# Patient Record
Sex: Male | Born: 1945
Health system: Southern US, Community
[De-identification: ages and names within clinical notes are randomized; demographics above are authoritative.]

## PROBLEM LIST (undated history)

## (undated) DIAGNOSIS — F329 Major depressive disorder, single episode, unspecified: Secondary | ICD-10-CM

## (undated) DIAGNOSIS — E559 Vitamin D deficiency, unspecified: Secondary | ICD-10-CM

## (undated) DIAGNOSIS — Z860100 Personal history of colon polyps, unspecified: Secondary | ICD-10-CM

## (undated) DIAGNOSIS — F32A Depression, unspecified: Secondary | ICD-10-CM

## (undated) DIAGNOSIS — E785 Hyperlipidemia, unspecified: Secondary | ICD-10-CM

## (undated) DIAGNOSIS — E0590 Thyrotoxicosis, unspecified without thyrotoxic crisis or storm: Secondary | ICD-10-CM

## (undated) HISTORY — DX: Depression, unspecified: F32.A

## (undated) HISTORY — DX: Major depressive disorder, single episode, unspecified: F32.9

## (undated) HISTORY — PX: OTHER SURGICAL HISTORY: SHX169

## (undated) HISTORY — DX: Vitamin D deficiency, unspecified: E55.9

## (undated) HISTORY — DX: Hyperlipidemia, unspecified: E78.5

## (undated) HISTORY — DX: Personal history of colon polyps, unspecified: Z86.0100

## (undated) HISTORY — PX: APPENDECTOMY: SHX54

## (undated) HISTORY — DX: Personal history of colon polyps: Z86.010

## (undated) HISTORY — DX: Thyrotoxicosis, unspecified without thyrotoxic crisis or storm: E05.90

---

## 2013-01-19 ENCOUNTER — Ambulatory Visit (INDEPENDENT_AMBULATORY_CARE_PROVIDER_SITE_OTHER): Payer: Managed Care, Other (non HMO) | Admitting: Family Medicine

## 2013-01-19 VITALS — BP 138/80 | HR 86 | Temp 98.0°F | Resp 16 | Ht 70.0 in | Wt 189.0 lb

## 2013-01-19 DIAGNOSIS — F329 Major depressive disorder, single episode, unspecified: Secondary | ICD-10-CM

## 2013-01-19 DIAGNOSIS — F32A Depression, unspecified: Secondary | ICD-10-CM

## 2013-01-19 DIAGNOSIS — F3289 Other specified depressive episodes: Secondary | ICD-10-CM

## 2013-01-19 DIAGNOSIS — E785 Hyperlipidemia, unspecified: Secondary | ICD-10-CM

## 2013-01-19 LAB — COMPREHENSIVE METABOLIC PANEL
ALT: 29 U/L (ref 0–53)
Albumin: 4.5 g/dL (ref 3.5–5.2)
Alkaline Phosphatase: 118 U/L — ABNORMAL HIGH (ref 39–117)
BUN: 17 mg/dL (ref 6–23)
CO2: 30 mEq/L (ref 19–32)
Calcium: 9.6 mg/dL (ref 8.4–10.5)
Chloride: 103 mEq/L (ref 96–112)
Potassium: 4.5 mEq/L (ref 3.5–5.3)
Total Protein: 7 g/dL (ref 6.0–8.3)

## 2013-01-19 LAB — LIPID PANEL
Cholesterol: 167 mg/dL (ref 0–200)
LDL Cholesterol: 90 mg/dL (ref 0–99)
Total CHOL/HDL Ratio: 3.4 Ratio
Triglycerides: 140 mg/dL (ref <150)
VLDL: 28 mg/dL (ref 0–40)

## 2013-01-19 MED ORDER — BUPROPION HCL ER (SMOKING DET) 150 MG PO TB12: 150.0000 mg | ORAL_TABLET | Freq: Two times a day (BID) | ORAL | Status: DC

## 2013-01-19 MED ORDER — SIMVASTATIN 40 MG PO TABS: 40.0000 mg | ORAL_TABLET | Freq: Every day | ORAL | Status: DC

## 2013-01-19 NOTE — Patient Instructions (Signed)
Continue current medications  Only no the results of your labs in a few days

## 2013-01-19 NOTE — Progress Notes (Signed)
Subjective: Patient recently moved here from [place]. He needs someone to represcribed his medicine for depression and hyperlipidemia. He has been pretty healthy. Her only surgeries were appendectomy and tonsillectomy. He has not had any major medical illnesses. He had his flu shot this year. Last colonoscopy was about 15 years ago, and I told him he needs to get known some time does not smoke. He found a job here and they move to be closer to their son. He is about out of his medications.  Objective: Pleasant gentleman in no acute distress. TMs normal. Throat clear. Neck supple without nodes thyromegaly. Chest clear to auscultation. Heart regular without murmurs. And soft nontender.  Assessment: Hyperlipidemia Depression stable  Plan: Refill his medications Check lipids and competent metabolic panel Return in about 3 months

## 2013-01-25 MED ORDER — BUPROPION HCL ER (SR) 150 MG PO TB12: 150.0000 mg | ORAL_TABLET | Freq: Two times a day (BID) | ORAL | Status: DC

## 2013-01-25 NOTE — Addendum Note (Signed)
Addended by: Thelma Barge D on: 01/25/2013 03:29 PM   Modules accepted: Orders

## 2013-07-11 ENCOUNTER — Other Ambulatory Visit: Payer: Self-pay | Admitting: Family Medicine

## 2013-07-12 ENCOUNTER — Other Ambulatory Visit: Payer: Self-pay | Admitting: Family Medicine

## 2016-06-03 DIAGNOSIS — R69 Illness, unspecified: Secondary | ICD-10-CM | POA: Diagnosis not present

## 2016-11-04 ENCOUNTER — Ambulatory Visit (INDEPENDENT_AMBULATORY_CARE_PROVIDER_SITE_OTHER): Payer: Medicare HMO | Admitting: Family Medicine

## 2016-11-04 ENCOUNTER — Encounter: Payer: Self-pay | Admitting: Family Medicine

## 2016-11-04 VITALS — BP 143/84 | HR 62 | Temp 97.4°F | Ht 68.3 in | Wt 184.1 lb

## 2016-11-04 DIAGNOSIS — Z23 Encounter for immunization: Secondary | ICD-10-CM

## 2016-11-04 DIAGNOSIS — E782 Mixed hyperlipidemia: Secondary | ICD-10-CM

## 2016-11-04 DIAGNOSIS — Z125 Encounter for screening for malignant neoplasm of prostate: Secondary | ICD-10-CM | POA: Diagnosis not present

## 2016-11-04 DIAGNOSIS — R6882 Decreased libido: Secondary | ICD-10-CM | POA: Diagnosis not present

## 2016-11-04 DIAGNOSIS — F3341 Major depressive disorder, recurrent, in partial remission: Secondary | ICD-10-CM

## 2016-11-04 MED ORDER — ESCITALOPRAM OXALATE 10 MG PO TABS: 10.0000 mg | ORAL_TABLET | Freq: Every day | ORAL | 1 refills | Status: DC

## 2016-11-04 MED ORDER — SIMVASTATIN 40 MG PO TABS: 40.0000 mg | ORAL_TABLET | Freq: Every day | ORAL | 1 refills | Status: DC

## 2016-11-04 MED ORDER — BUPROPION HCL ER (SR) 150 MG PO TB12: 150.0000 mg | ORAL_TABLET | Freq: Two times a day (BID) | ORAL | 1 refills | Status: DC

## 2016-11-04 NOTE — Progress Notes (Signed)
BP (!) 143/84 (BP Location: Left Arm, Patient Position: Sitting, Cuff Size: Normal)   Pulse 62   Temp (!) 97.4 F (36.3 C)   Ht 5' 8.3" (1.735 m)   Wt 184 lb 1 oz (83.5 kg)   SpO2 98%   BMI 27.74 kg/m    Subjective:    Patient ID: Wayne Robles, male    DOB: Apr 10, 1945, 71 y.o.   MRN: [id number]  HPIOmar Person Robles is a 71 y.o. male  Chief Complaint  Patient presents with  . Establish Care   DEPRESSION- notes that he has not had the energy that he would like and has been having some issues with decreased libido Mood status: stable Satisfied with current treatment?: no Symptom severity: mild  Duration of current treatment : chronic Side effects: yes- sexual side effects, getting worse Medication compliance: excellent compliance Psychotherapy/counseling: no  Previous psychiatric medications: wellbutrin, prozac Depressed mood: no Anxious mood: no Anhedonia: no Significant weight loss or gain: no Insomnia: no  Fatigue: no Feelings of worthlessness or guilt: no Impaired concentration/indecisiveness: no Suicidal ideations: no Hopelessness: no Crying spells: no Depression screen [facility] 2/9 11/04/2016 11/04/2016  Decreased Interest 0 0  Down, Depressed, Hopeless 0 0  PHQ - 2 Score 0 0  Altered sleeping 0 -  Tired, decreased energy 0 -  Change in appetite 0 -  Feeling bad or failure about yourself  0 -  Trouble concentrating 0 -  Moving slowly or fidgety/restless 0 -  Suicidal thoughts 0 -  PHQ-9 Score 0 -    HYPERLIPIDEMIA Hyperlipidemia status: excellent compliance Satisfied with current treatment?  yes Side effects:  no Medication compliance: excellent compliance Past cholesterol meds: atorvastatin Supplements: none Aspirin:  no The ASCVD Risk score Wayne Robles., et al., 2013) failed to calculate for the following reasons:   Cannot find a previous HDL lab   Cannot find a previous total cholesterol lab Chest pain:  no Coronary artery disease:  no Family  history CAD:  no   Active Ambulatory Problems    Diagnosis Date Noted  . Depression 01/19/2013  . Hyperlipidemia 01/19/2013   Resolved Ambulatory Problems    Diagnosis Date Noted  . No Resolved Ambulatory Problems   Past Medical History:  Diagnosis Date  . Depression   . Hyperlipidemia    Past Surgical History:  Procedure Laterality Date  . APPENDECTOMY    . tonsillectomy     Outpatient Encounter Prescriptions as of 11/04/2016  Medication Sig  . [DISCONTINUED] FLUoxetine (PROZAC) 20 MG tablet Take 20 mg by mouth daily.  Marland Kitchen buPROPion (WELLBUTRIN SR) 150 MG 12 hr tablet Take 1 tablet (150 mg total) by mouth 2 (two) times daily. PATIENT NEEDS OFFICE VISIT FOR ADDITIONAL REFILLS  . escitalopram (LEXAPRO) 10 MG tablet Take 1 tablet (10 mg total) by mouth daily.  . simvastatin (ZOCOR) 40 MG tablet Take 1 tablet (40 mg total) by mouth daily.  . [DISCONTINUED] aspirin EC 81 MG tablet Take 81 mg by mouth daily.  . [DISCONTINUED] buPROPion (WELLBUTRIN SR) 150 MG 12 hr tablet Take 1 tablet (150 mg total) by mouth 2 (two) times daily. PATIENT NEEDS OFFICE VISIT FOR ADDITIONAL REFILLS  . [DISCONTINUED] Multiple Vitamin (MULTIVITAMIN) capsule Take 1 capsule by mouth daily.  . [DISCONTINUED] simvastatin (ZOCOR) 40 MG tablet Take 1 tablet (40 mg total) by mouth daily.   No facility-administered encounter medications on file as of 11/04/2016.    No Known Allergies  Social History   Social History  .  Marital status: Married    Spouse name: N/A  . Number of children: N/A  . Years of education: N/A   Occupational History  . Not on file.   Social History Main Topics  . Smoking status: Never Smoker  . Smokeless tobacco: Never Used  . Alcohol use No  . Drug use: No  . Sexual activity: Yes   Other Topics Concern  . Not on file   Social History Narrative  . No narrative on file   Family History  Problem Relation Age of Onset  . Cancer Mother        Ovarian  . Diabetes Father   .  Cancer Father        Prostate    Review of Systems  Constitutional: Negative.   Respiratory: Negative.   Cardiovascular: Negative.   Gastrointestinal: Negative.   Genitourinary:       Decreased libido  Psychiatric/Behavioral: Negative.     Per HPI unless specifically indicated above     Objective:    BP (!) 143/84 (BP Location: Left Arm, Patient Position: Sitting, Cuff Size: Normal)   Pulse 62   Temp (!) 97.4 F (36.3 C)   Ht 5' 8.3" (1.735 m)   Wt 184 lb 1 oz (83.5 kg)   SpO2 98%   BMI 27.74 kg/m   Wt Readings from Last 3 Encounters:  11/04/16 184 lb 1 oz (83.5 kg)  01/19/13 189 lb (85.7 kg)    Physical Exam  Constitutional: He is oriented to person, place, and time. He appears well-developed and well-nourished. No distress.  HENT:  Head: Normocephalic and atraumatic.  Right Ear: Hearing normal.  Left Ear: Hearing normal.  Nose: Nose normal.  Eyes: Conjunctivae and lids are normal. Right eye exhibits no discharge. Left eye exhibits no discharge. No scleral icterus.  Cardiovascular: Normal rate, regular rhythm, normal heart sounds and intact distal pulses.  Exam reveals no gallop and no friction rub.   No murmur heard. Pulmonary/Chest: Effort normal and breath sounds normal. No respiratory distress. He has no wheezes. He has no rales. He exhibits no tenderness.  Musculoskeletal: Normal range of motion.  Neurological: He is alert and oriented to person, place, and time.  Skin: Skin is warm, dry and intact. No rash noted. He is not diaphoretic. No erythema. No pallor.  Psychiatric: He has a normal mood and affect. His speech is normal and behavior is normal. Judgment and thought content normal. Cognition and memory are normal.  Nursing note and vitals reviewed.   Results for orders placed or performed in visit on 01/19/13  Lipid panel  Result Value Ref Range   Cholesterol 167 0 - 200 mg/dL   Triglycerides 621 <308 mg/dL   HDL 49 >65 mg/dL   Total CHOL/HDL Ratio  3.4 Ratio   VLDL 28 0 - 40 mg/dL   LDL Cholesterol 90 0 - 99 mg/dL  Comprehensive metabolic panel  Result Value Ref Range   Sodium 139 135 - 145 mEq/L   Potassium 4.5 3.5 - 5.3 mEq/L   Chloride 103 96 - 112 mEq/L   CO2 30 19 - 32 mEq/L   Glucose, Bld 96 70 - 99 mg/dL   BUN 17 6 - 23 mg/dL   Creat 7.84 6.96 - 2.95 mg/dL   Total Bilirubin 0.9 0.3 - 1.2 mg/dL   Alkaline Phosphatase 118 (H) 39 - 117 U/L   AST 33 0 - 37 U/L   ALT 29 0 - 53 U/L   Total  Protein 7.0 6.0 - 8.3 g/dL   Albumin 4.5 3.5 - 5.2 g/dL   Calcium 9.6 8.4 - 95.6 mg/dL      Assessment & Plan:   Problem List Items Addressed This Visit      Other   Depression - Primary    Under good control but having issues with his prozac, so will change to lexparo. Recheck by phone in 1 month. Call with any concerns.       Relevant Medications   escitalopram (LEXAPRO) 10 MG tablet   buPROPion (WELLBUTRIN SR) 150 MG 12 hr tablet   Other Relevant Orders   CBC with Differential/Platelet   TSH   Hyperlipidemia    Has been stable per patient. Will check labs and continue current regimen. Continue to monitor. Call with any concerns.       Relevant Medications   simvastatin (ZOCOR) 40 MG tablet   Other Relevant Orders   Comprehensive metabolic panel   Lipid Panel w/o Chol/HDL Ratio    Other Visit Diagnoses    Screening for prostate cancer       Labs checked today- await results.    Relevant Orders   PSA   Immunization due       Flu shot given today- will check records on other vaccines.    Relevant Orders   HIV antibody   Flu vaccine HIGH DOSE PF (Fluzone High dose) (Completed)   Decreased libido       ?due to prozac- will change to lexapro and check in by phone in 1 month. Call with any concerns. [place] checking testosterone.   Relevant Orders   Testosterone, free, total       Follow up plan: Return in about 6 months (around 05/04/2017) for Wellness, records release please.

## 2016-11-04 NOTE — Assessment & Plan Note (Signed)
Under good control but having issues with his prozac, so will change to lexparo. Recheck by phone in 1 month. Call with any concerns.

## 2016-11-04 NOTE — Assessment & Plan Note (Signed)
Has been stable per patient. Will check labs and continue current regimen. Continue to monitor. Call with any concerns.

## 2016-11-05 LAB — CBC WITH DIFFERENTIAL/PLATELET
BASOS ABS: 0 10*3/uL (ref 0.0–0.2)
Basos: 0 %
EOS (ABSOLUTE): 0 10*3/uL (ref 0.0–0.4)
Eos: 1 %
Hematocrit: 47.2 % (ref 37.5–51.0)
Hemoglobin: 15.9 g/dL (ref 13.0–17.7)
Immature Grans (Abs): 0 10*3/uL (ref 0.0–0.1)
Immature Granulocytes: 1 %
LYMPHS: 23 %
Lymphocytes Absolute: 1.3 10*3/uL (ref 0.7–3.1)
MCH: 31.2 pg (ref 26.6–33.0)
MCHC: 33.7 g/dL (ref 31.5–35.7)
MCV: 93 fL (ref 79–97)
Monocytes Absolute: 0.5 10*3/uL (ref 0.1–0.9)
Monocytes: 8 %
NEUTROS ABS: 4 10*3/uL (ref 1.4–7.0)
Neutrophils: 67 %
Platelets: 206 10*3/uL (ref 150–379)
RBC: 5.1 x10E6/uL (ref 4.14–5.80)
RDW: 14.1 % (ref 12.3–15.4)
WBC: 5.9 10*3/uL (ref 3.4–10.8)

## 2016-11-05 LAB — LIPID PANEL W/O CHOL/HDL RATIO
Cholesterol, Total: 235 mg/dL — ABNORMAL HIGH (ref 100–199)
HDL: 36 mg/dL — ABNORMAL LOW (ref >39)
Triglycerides: 493 mg/dL — ABNORMAL HIGH (ref 0–149)

## 2016-11-05 LAB — COMPREHENSIVE METABOLIC PANEL
ALBUMIN: 4.4 g/dL (ref 3.6–4.8)
ALT: 25 IU/L (ref 0–44)
AST: 31 IU/L (ref 0–40)
Albumin/Globulin Ratio: 1.8 (ref 1.2–2.2)
Alkaline Phosphatase: 130 IU/L — ABNORMAL HIGH (ref 39–117)
BUN/Creatinine Ratio: 20 (ref 10–24)
BUN: 20 mg/dL (ref 8–27)
Bilirubin Total: 0.9 mg/dL (ref 0.0–1.2)
CHLORIDE: 101 mmol/L (ref 96–106)
CO2: 22 mmol/L (ref 20–29)
Calcium: 9.3 mg/dL (ref 8.6–10.2)
Creatinine, Ser: 1.01 mg/dL (ref 0.76–1.27)
GFR calc non Af Amer: 76 mL/min/{1.73_m2} (ref >59)
GFR, EST AFRICAN AMERICAN: 88 mL/min/{1.73_m2} (ref >59)
GLUCOSE: 95 mg/dL (ref 65–99)
Globulin, Total: 2.4 g/dL (ref 1.5–4.5)
Potassium: 4.4 mmol/L (ref 3.5–5.2)
Sodium: 140 mmol/L (ref 134–144)
TOTAL PROTEIN: 6.8 g/dL (ref 6.0–8.5)

## 2016-11-05 LAB — TSH: TSH: 2.27 u[IU]/mL (ref 0.450–4.500)

## 2016-11-05 LAB — TESTOSTERONE, FREE, TOTAL, SHBG
Sex Hormone Binding: 69.5 nmol/L (ref 19.3–76.4)
TESTOSTERONE FREE: 5.7 pg/mL — AB (ref 6.6–18.1)
Testosterone: 421 ng/dL (ref 264–916)

## 2016-11-05 LAB — HIV ANTIBODY (ROUTINE TESTING W REFLEX): HIV SCREEN 4TH GENERATION: NONREACTIVE

## 2016-11-05 LAB — PSA: Prostate Specific Ag, Serum: 1.4 ng/mL (ref 0.0–4.0)

## 2016-11-07 ENCOUNTER — Telehealth: Payer: Self-pay | Admitting: Family Medicine

## 2016-11-07 ENCOUNTER — Encounter: Payer: Self-pay | Admitting: Family Medicine

## 2016-11-07 NOTE — Telephone Encounter (Signed)
Patient notified

## 2016-11-07 NOTE — Telephone Encounter (Signed)
Please let him know that his labs look good. His cholesterol is a little high, but everything else looks great. If it's still high in 6 months we may adjust his dose. Thanks!

## 2016-12-02 ENCOUNTER — Encounter: Payer: Self-pay | Admitting: Family Medicine

## 2016-12-02 MED ORDER — SILDENAFIL CITRATE 100 MG PO TABS: 50.0000 mg | ORAL_TABLET | Freq: Every day | ORAL | 11 refills | Status: DC | PRN

## 2016-12-09 MED ORDER — SILDENAFIL CITRATE 100 MG PO TABS: 50.0000 mg | ORAL_TABLET | Freq: Every day | ORAL | 11 refills | Status: DC | PRN

## 2016-12-09 NOTE — Addendum Note (Signed)
Addended by: Dorcas CarrowJOHNSON, Aspen Deterding P on: 12/09/2016 05:05 PM   Modules accepted: Orders

## 2016-12-09 NOTE — Telephone Encounter (Signed)
Dr.Johnson, [facility].

## 2017-01-20 ENCOUNTER — Encounter: Payer: Self-pay | Admitting: Family Medicine

## 2017-01-20 MED ORDER — SIMVASTATIN 40 MG PO TABS: 40.0000 mg | ORAL_TABLET | Freq: Every day | ORAL | 1 refills | Status: DC

## 2017-01-20 MED ORDER — ESCITALOPRAM OXALATE 10 MG PO TABS: 10.0000 mg | ORAL_TABLET | Freq: Every day | ORAL | 1 refills | Status: DC

## 2017-01-20 MED ORDER — BUPROPION HCL ER (SR) 150 MG PO TB12: 150.0000 mg | ORAL_TABLET | Freq: Two times a day (BID) | ORAL | 1 refills | Status: DC

## 2017-05-08 ENCOUNTER — Encounter: Payer: Medicare HMO | Admitting: Family Medicine

## 2019-11-19 ENCOUNTER — Telehealth: Payer: Self-pay

## 2019-11-19 NOTE — Telephone Encounter (Signed)
Pt had np appt 11/04/2016. Please advise if pt needs to reestablish as np the first of the year?  Copied from CRM [id number]. Topic: Appointment Scheduling - Scheduling Inquiry for Clinic >> Nov 19, 2019  3:03 PM Wayne Robles wrote: Reason for CRM: pt states he just moved back into the area and would like to know if Dr Laural Benes will accept him back as a pt? He only saw her once 11/04/2016

## 2019-11-19 NOTE — Telephone Encounter (Signed)
Sure- Jan

## 2019-11-20 NOTE — Telephone Encounter (Signed)
Lvm letting pt know we can schedule NP apt at the end of January.

## 2019-12-20 DIAGNOSIS — I10 Essential (primary) hypertension: Secondary | ICD-10-CM | POA: Diagnosis not present

## 2019-12-20 DIAGNOSIS — F419 Anxiety disorder, unspecified: Secondary | ICD-10-CM | POA: Diagnosis not present

## 2019-12-20 DIAGNOSIS — E0590 Thyrotoxicosis, unspecified without thyrotoxic crisis or storm: Secondary | ICD-10-CM | POA: Diagnosis not present

## 2020-01-09 LAB — LIPID PANEL
Cholesterol: 256 — AB (ref 0–200)
HDL: 39 (ref 35–70)
LDL Cholesterol: 144
Triglycerides: 363 — AB (ref 40–160)

## 2020-01-09 LAB — BASIC METABOLIC PANEL
BUN: 21 (ref 4–21)
CO2: 30 — AB (ref 13–22)
Chloride: 102 (ref 99–108)
Creatinine: 0.9 (ref 0.6–1.3)
Glucose: 85
Potassium: 4.4 (ref 3.4–5.3)
Sodium: 136 — AB (ref 137–147)

## 2020-01-09 LAB — HEPATIC FUNCTION PANEL
ALT: 33 (ref 10–40)
AST: 32 (ref 14–40)
Alkaline Phosphatase: 119 (ref 25–125)
Bilirubin, Total: 1.2

## 2020-01-09 LAB — VITAMIN D 25 HYDROXY (VIT D DEFICIENCY, FRACTURES): Vit D, 25-Hydroxy: 24.64

## 2020-01-09 LAB — COMPREHENSIVE METABOLIC PANEL
Albumin: 4.6 (ref 3.5–5.0)
Calcium: 9.3 (ref 8.7–10.7)
GFR calc non Af Amer: 83
Globulin: 2.6

## 2020-01-09 LAB — TSH: TSH: 2.27 (ref 0.41–5.90)

## 2020-01-09 LAB — HEMOGLOBIN A1C: Hemoglobin A1C: 6

## 2020-02-25 LAB — HM COLONOSCOPY

## 2020-03-12 ENCOUNTER — Ambulatory Visit: Payer: Medicare HMO | Admitting: Family Medicine

## 2020-05-29 ENCOUNTER — Other Ambulatory Visit: Payer: Self-pay

## 2020-05-29 ENCOUNTER — Encounter: Payer: Self-pay | Admitting: Family Medicine

## 2020-05-29 ENCOUNTER — Ambulatory Visit: Payer: Medicare HMO | Admitting: Family Medicine

## 2020-05-29 VITALS — BP 131/89 | HR 74 | Temp 98.2°F | Ht 69.25 in | Wt 188.4 lb

## 2020-05-29 DIAGNOSIS — Z125 Encounter for screening for malignant neoplasm of prostate: Secondary | ICD-10-CM | POA: Diagnosis not present

## 2020-05-29 DIAGNOSIS — R6882 Decreased libido: Secondary | ICD-10-CM

## 2020-05-29 DIAGNOSIS — F3341 Major depressive disorder, recurrent, in partial remission: Secondary | ICD-10-CM | POA: Diagnosis not present

## 2020-05-29 DIAGNOSIS — E782 Mixed hyperlipidemia: Secondary | ICD-10-CM

## 2020-05-29 DIAGNOSIS — Z8639 Personal history of other endocrine, nutritional and metabolic disease: Secondary | ICD-10-CM

## 2020-05-29 LAB — URINALYSIS, ROUTINE W REFLEX MICROSCOPIC
Bilirubin, UA: NEGATIVE
Glucose, UA: NEGATIVE
Ketones, UA: NEGATIVE
Leukocytes,UA: NEGATIVE
Nitrite, UA: NEGATIVE
Protein,UA: NEGATIVE
RBC, UA: NEGATIVE
Specific Gravity, UA: <1.005 — ABNORMAL LOW (ref 1.005–1.030)
Urobilinogen, Ur: 0.2 mg/dL (ref 0.2–1.0)
pH, UA: 6 (ref 5.0–7.5)

## 2020-05-29 LAB — MICROALBUMIN, URINE WAIVED
Creatinine, Urine Waived: 50 mg/dL (ref 10–300)
Microalb, Ur Waived: 10 mg/L (ref 0–19)
Microalb/Creat Ratio: <30 mg/g (ref <30)

## 2020-05-29 LAB — BAYER DCA HB A1C WAIVED: HB A1C (BAYER DCA - WAIVED): 5.9 % (ref <7.0)

## 2020-05-29 MED ORDER — BUPROPION HCL ER (XL) 300 MG PO TB24: 300.0000 mg | ORAL_TABLET | Freq: Every day | ORAL | 3 refills | Status: DC

## 2020-05-29 NOTE — Progress Notes (Signed)
BP 131/89   Pulse 74   Temp 98.2 F (36.8 C)   Ht 5' 9.25" (1.759 m)   Wt 188 lb 6.4 oz (85.5 kg)   SpO2 97%   BMI 27.62 kg/m    Subjective:    Patient ID: Wayne Robles, male    DOB: 19-Apr-1945, 75 y.o.   MRN: [id number]  HPI: Wayne GourdRichard P Nazir is a 75 y.o. male who presents today to establish care [place], [place] and is here to reestablish, [facility] and he felt like he was just giving him medicine and was not happy with the care he was getting there.   Chief Complaint  Patient presents with  . re-establish care    Patient would like to discuss his medications   . Hyperlipidemia  . Depression   HYPERLIPIDEMIA Hyperlipidemia status: stable Satisfied with current treatment?  yes Side effects:  no Medication compliance: excellent compliance Past cholesterol meds: simvastatin, zetia Supplements: none Aspirin:  no The ASCVD Risk score Denman George(Goff DC Jr., et al., 2013) failed to calculate for the following reasons:   Cannot find a previous HDL lab   Cannot find a previous total cholesterol lab Chest pain:  no Coronary artery disease:  no  DEPRESSION- has been following with psychiatrist who started him on klonopin. He notes that he does not need a refill at this time.  Mood status: uncontrolled Satisfied with current treatment?: no Symptom severity: moderate  Duration of current treatment : months Side effects: no Medication compliance: fair compliance Psychotherapy/counseling: no  Previous psychiatric medications: lexapro, wellbutrin, clonazepam Depressed mood: yes Anxious mood: yes Anhedonia: no Significant weight loss or gain: no Insomnia: no  Fatigue: yes Feelings of worthlessness or guilt: no Impaired concentration/indecisiveness: yes Suicidal ideations: no Hopelessness: no Crying spells: no Depression screen [facility] 2/9 05/29/2020 11/04/2016 11/04/2016  Decreased Interest 2 0 0  Down,  Depressed, Hopeless 2 0 0  PHQ - 2 Score 4 0 0  Altered sleeping 3 0 -  Tired, decreased energy 0 0 -  Change in appetite 0 0 -  Feeling bad or failure about yourself  0 0 -  Trouble concentrating 1 0 -  Moving slowly or fidgety/restless 0 0 -  Suicidal thoughts 0 0 -  PHQ-9 Score 8 0 -  Difficult doing work/chores Somewhat difficult - -    Active Ambulatory Problems    Diagnosis Date Noted  . Depression 01/19/2013  . Hyperlipidemia 01/19/2013   Resolved Ambulatory Problems    Diagnosis Date Noted  . No Resolved Ambulatory Problems   No Additional Past Medical History   Past Surgical History:  Procedure Laterality Date  . APPENDECTOMY    . tonsillectomy     Outpatient Encounter Medications as of 05/29/2020  Medication Sig Note  . buPROPion (WELLBUTRIN XL) 300 MG 24 hr tablet Take 1 tablet (300 mg total) by mouth daily.   . clonazePAM (KLONOPIN) 0.5 MG tablet Take 0.5 mg by mouth. 05/29/2020: Take 1/2 tablet as needed.  . ezetimibe (ZETIA) 10 MG tablet Take 10 mg by mouth daily. 05/29/2020: Take 1/2 tablet  . Multiple Vitamin (MULTIVITAMIN) tablet Take 1 tablet by mouth daily.   . simvastatin (ZOCOR) 40 MG tablet Take 1 tablet (40 mg total) by mouth daily.   . [DISCONTINUED] buPROPion (WELLBUTRIN SR) 150 MG 12 hr tablet Take 1 tablet (150 mg total) by mouth 2 (two) times daily.  PATIENT NEEDS OFFICE VISIT FOR ADDITIONAL REFILLS   . [DISCONTINUED] escitalopram (LEXAPRO) 10 MG tablet Take 1 tablet (10 mg total) by mouth daily. (Patient not taking: Reported on 05/29/2020)   . [DISCONTINUED] metFORMIN (GLUCOPHAGE) 500 MG tablet Take by mouth 2 (two) times daily with a meal. (Patient not taking: Reported on 05/29/2020) 05/29/2020: 1/2 tablet BID  . [DISCONTINUED] sildenafil (VIAGRA) 100 MG tablet Take 0.5-1 tablets (50-100 mg total) by mouth daily as needed for erectile dysfunction.    No facility-administered encounter medications on file as of 05/29/2020.   No Known Allergies  Social  History   Socioeconomic History  . Marital status: Married    Spouse name: Not on file  . Number of children: Not on file  . Years of education: Not on file  . Highest education level: Not on file  Occupational History  . Not on file  Tobacco Use  . Smoking status: Never Smoker  . Smokeless tobacco: Never Used  Vaping Use  . Vaping Use: Never used  Substance and Sexual Activity  . Alcohol use: No  . Drug use: No  . Sexual activity: Yes  Other Topics Concern  . Not on file  Social History Narrative  . Not on file   Social Determinants of Health   Financial Resource Strain: Not on file  Food Insecurity: Not on file  Transportation Needs: Not on file  Physical Activity: Not on file  Stress: Not on file  Social Connections: Not on file   Family History  Problem Relation Age of Onset  . Cancer Mother        Ovarian  . Diabetes Father   . Cancer Father        Prostate    Review of Systems  Constitutional: Negative.   Respiratory: Negative.   Cardiovascular: Negative.   Gastrointestinal: Negative.   Musculoskeletal: Negative.   Psychiatric/Behavioral: Positive for dysphoric mood. Negative for agitation, behavioral problems, confusion, decreased concentration, hallucinations, self-injury, sleep disturbance and suicidal ideas. The patient is nervous/anxious. The patient is not hyperactive.     Per HPI unless specifically indicated above     Objective:    BP 131/89   Pulse 74   Temp 98.2 F (36.8 C)   Ht 5' 9.25" (1.759 m)   Wt 188 lb 6.4 oz (85.5 kg)   SpO2 97%   BMI 27.62 kg/m   Wt Readings from Last 3 Encounters:  05/29/20 188 lb 6.4 oz (85.5 kg)  11/04/16 184 lb 1 oz (83.5 kg)  01/19/13 189 lb (85.7 kg)    Physical Exam Vitals and nursing note reviewed.  Constitutional:      General: He is not in acute distress.    Appearance: Normal appearance. He is not ill-appearing, toxic-appearing or diaphoretic.  HENT:     Head: Normocephalic and atraumatic.      Right Ear: External ear normal.     Left Ear: External ear normal.     Nose: Nose normal.     Mouth/Throat:     Mouth: Mucous membranes are moist.     Pharynx: Oropharynx is clear.  Eyes:     General: No scleral icterus.       Right eye: No discharge.        Left eye: No discharge.     Extraocular Movements: Extraocular movements intact.     Conjunctiva/sclera: Conjunctivae normal.     Pupils: Pupils are equal, round, and reactive to light.  Cardiovascular:     Rate  and Rhythm: Normal rate and regular rhythm.     Pulses: Normal pulses.     Heart sounds: Normal heart sounds. No murmur heard. No friction rub. No gallop.   Pulmonary:     Effort: Pulmonary effort is normal. No respiratory distress.     Breath sounds: Normal breath sounds. No stridor. No wheezing, rhonchi or rales.  Chest:     Chest wall: No tenderness.  Musculoskeletal:        General: Normal range of motion.     Cervical back: Normal range of motion and neck supple.  Skin:    General: Skin is warm and dry.     Capillary Refill: Capillary refill takes less than 2 seconds.     Coloration: Skin is not jaundiced or pale.     Findings: No bruising, erythema, lesion or rash.  Neurological:     General: No focal deficit present.     Mental Status: He is alert and oriented to person, place, and time. Mental status is at baseline.  Psychiatric:        Mood and Affect: Mood normal.        Behavior: Behavior normal.        Thought Content: Thought content normal.        Judgment: Judgment normal.     Results for orders placed or performed in visit on 11/04/16  CBC with Differential/Platelet  Result Value Ref Range   WBC 5.9 3.4 - 10.8 x10E3/uL   RBC 5.10 4.14 - 5.80 x10E6/uL   Hemoglobin 15.9 13.0 - 17.7 g/dL   Hematocrit 29.9 37.1 - 51.0 %   MCV 93 79 - 97 fL   MCH 31.2 26.6 - 33.0 pg   MCHC 33.7 31.5 - 35.7 g/dL   RDW 69.6 78.9 - 38.1 %   Platelets 206 150 - 379 x10E3/uL   Neutrophils 67 Not Estab. %    Lymphs 23 Not Estab. %   Monocytes 8 Not Estab. %   Eos 1 Not Estab. %   Basos 0 Not Estab. %   Neutrophils Absolute 4.0 1.4 - 7.0 x10E3/uL   Lymphocytes Absolute 1.3 0.7 - 3.1 x10E3/uL   Monocytes Absolute 0.5 0.1 - 0.9 x10E3/uL   EOS (ABSOLUTE) 0.0 0.0 - 0.4 x10E3/uL   Basophils Absolute 0.0 0.0 - 0.2 x10E3/uL   Immature Granulocytes 1 Not Estab. %   Immature Grans (Abs) 0.0 0.0 - 0.1 x10E3/uL  Comprehensive metabolic panel  Result Value Ref Range   Glucose 95 65 - 99 mg/dL   BUN 20 8 - 27 mg/dL   Creatinine, Ser 0.17 0.76 - 1.27 mg/dL   GFR calc non Af Amer 76 >59 mL/min/1.73   GFR calc Af Amer 88 >59 mL/min/1.73   BUN/Creatinine Ratio 20 10 - 24   Sodium 140 134 - 144 mmol/L   Potassium 4.4 3.5 - 5.2 mmol/L   Chloride 101 96 - 106 mmol/L   CO2 22 20 - 29 mmol/L   Calcium 9.3 8.6 - 10.2 mg/dL   Total Protein 6.8 6.0 - 8.5 g/dL   Albumin 4.4 3.6 - 4.8 g/dL   Globulin, Total 2.4 1.5 - 4.5 g/dL   Albumin/Globulin Ratio 1.8 1.2 - 2.2   Bilirubin Total 0.9 0.0 - 1.2 mg/dL   Alkaline Phosphatase 130 (H) 39 - 117 IU/L   AST 31 0 - 40 IU/L   ALT 25 0 - 44 IU/L  Lipid Panel w/o Chol/HDL Ratio  Result Value Ref Range  Cholesterol, Total 235 (H) 100 - 199 mg/dL   Triglycerides 941 (H) 0 - 149 mg/dL   HDL 36 (L) >74 mg/dL   VLDL Cholesterol Cal Comment 5 - 40 mg/dL   LDL Calculated Comment 0 - 99 mg/dL  TSH  Result Value Ref Range   TSH 2.270 0.450 - 4.500 uIU/mL  PSA  Result Value Ref Range   Prostate Specific Ag, Serum 1.4 0.0 - 4.0 ng/mL  HIV antibody  Result Value Ref Range   HIV Screen 4th Generation wRfx Non Reactive Non Reactive  Testosterone, free, total  Result Value Ref Range   Testosterone 421 264 - 916 ng/dL   Testosterone, Free 5.7 (L) 6.6 - 18.1 pg/mL   Sex Hormone Binding 69.5 19.3 - 76.4 nmol/L      Assessment & Plan:   Problem List Items Addressed This Visit      Other   Depression    Not under good control. Will increase his wellbutrin to 300mg   and change to XR. Will follow up 1 month. Call with any concerns.       Relevant Medications   buPROPion (WELLBUTRIN XL) 300 MG 24 hr tablet   Other Relevant Orders   Comprehensive metabolic panel   CBC with Differential/Platelet   TSH   Hyperlipidemia - Primary    Rechecking labs today. Await results. Continue current regimen at this time. Does not need refills today.      Relevant Medications   ezetimibe (ZETIA) 10 MG tablet   Other Relevant Orders   Comprehensive metabolic panel   CBC with Differential/Platelet   Lipid Panel w/o Chol/HDL Ratio    Other Visit Diagnoses    Screening for prostate cancer       Labs drawn today. Await results. Treat as needed.    Relevant Orders   PSA   Decreased libido       Will try to get his mood under better control. Will check labs and testosterone. Return as able for testosterone.   Relevant Orders   Testosterone, free, total([facility])   History of hyperglycemia       A1c 5.9- no need for metformin. Will stop. Continue to monitor. Call with any concerns.    Relevant Orders   Bayer DCA Hb A1c Waived   Comprehensive metabolic panel   CBC with Differential/Platelet   Microalbumin, Urine Waived   Urinalysis, Routine w reflex microscopic       Follow up plan: Return in about 4 weeks (around 06/26/2020) for Records release please.

## 2020-05-29 NOTE — Assessment & Plan Note (Signed)
Rechecking labs today. Await results. Continue current regimen at this time. Does not need refills today.

## 2020-05-29 NOTE — Assessment & Plan Note (Signed)
Not under good control. Will increase his wellbutrin to 300mg  and change to XR. Will follow up 1 month. Call with any concerns.

## 2020-05-30 LAB — COMPREHENSIVE METABOLIC PANEL
ALT: 32 IU/L (ref 0–44)
AST: 31 IU/L (ref 0–40)
Albumin/Globulin Ratio: 1.8 (ref 1.2–2.2)
Albumin: 4.6 g/dL (ref 3.7–4.7)
Alkaline Phosphatase: 134 IU/L — ABNORMAL HIGH (ref 44–121)
BUN/Creatinine Ratio: 14 (ref 10–24)
BUN: 14 mg/dL (ref 8–27)
Bilirubin Total: 1.1 mg/dL (ref 0.0–1.2)
CO2: 20 mmol/L (ref 20–29)
Calcium: 9.5 mg/dL (ref 8.6–10.2)
Chloride: 100 mmol/L (ref 96–106)
Creatinine, Ser: 0.97 mg/dL (ref 0.76–1.27)
Globulin, Total: 2.5 g/dL (ref 1.5–4.5)
Glucose: 72 mg/dL (ref 65–99)
Potassium: 4.3 mmol/L (ref 3.5–5.2)
Sodium: 138 mmol/L (ref 134–144)
Total Protein: 7.1 g/dL (ref 6.0–8.5)
eGFR: 83 mL/min/{1.73_m2} (ref >59)

## 2020-05-30 LAB — LIPID PANEL W/O CHOL/HDL RATIO
Cholesterol, Total: 188 mg/dL (ref 100–199)
HDL: 45 mg/dL (ref >39)
LDL Chol Calc (NIH): 94 mg/dL (ref 0–99)
Triglycerides: 293 mg/dL — ABNORMAL HIGH (ref 0–149)
VLDL Cholesterol Cal: 49 mg/dL — ABNORMAL HIGH (ref 5–40)

## 2020-05-30 LAB — CBC WITH DIFFERENTIAL/PLATELET
Basophils Absolute: 0 10*3/uL (ref 0.0–0.2)
Basos: 0 %
EOS (ABSOLUTE): 0.1 10*3/uL (ref 0.0–0.4)
Eos: 1 %
Hematocrit: 50.6 % (ref 37.5–51.0)
Hemoglobin: 16.8 g/dL (ref 13.0–17.7)
Immature Grans (Abs): 0 10*3/uL (ref 0.0–0.1)
Immature Granulocytes: 1 %
Lymphocytes Absolute: 1.7 10*3/uL (ref 0.7–3.1)
Lymphs: 23 %
MCH: 31.3 pg (ref 26.6–33.0)
MCHC: 33.2 g/dL (ref 31.5–35.7)
MCV: 94 fL (ref 79–97)
Monocytes Absolute: 0.7 10*3/uL (ref 0.1–0.9)
Monocytes: 9 %
Neutrophils Absolute: 4.9 10*3/uL (ref 1.4–7.0)
Neutrophils: 66 %
Platelets: 239 10*3/uL (ref 150–450)
RBC: 5.37 x10E6/uL (ref 4.14–5.80)
RDW: 12.7 % (ref 11.6–15.4)
WBC: 7.4 10*3/uL (ref 3.4–10.8)

## 2020-05-30 LAB — PSA: Prostate Specific Ag, Serum: 1.9 ng/mL (ref 0.0–4.0)

## 2020-05-30 LAB — TSH: TSH: 2.82 u[IU]/mL (ref 0.450–4.500)

## 2020-06-01 ENCOUNTER — Other Ambulatory Visit: Payer: Medicare HMO

## 2020-06-01 ENCOUNTER — Other Ambulatory Visit: Payer: Self-pay

## 2020-06-01 DIAGNOSIS — R6882 Decreased libido: Secondary | ICD-10-CM

## 2020-06-03 LAB — TESTOSTERONE, FREE, TOTAL, SHBG
Sex Hormone Binding: 59.3 nmol/L (ref 19.3–76.4)
Testosterone, Free: 5.1 pg/mL — ABNORMAL LOW (ref 6.6–18.1)
Testosterone: 499 ng/dL (ref 264–916)

## 2020-06-26 ENCOUNTER — Ambulatory Visit: Payer: Medicare HMO | Admitting: Family Medicine

## 2020-06-26 ENCOUNTER — Encounter: Payer: Self-pay | Admitting: Family Medicine

## 2020-06-26 ENCOUNTER — Other Ambulatory Visit: Payer: Self-pay

## 2020-06-26 VITALS — BP 131/86 | HR 84 | Temp 98.2°F | Wt 186.2 lb

## 2020-06-26 DIAGNOSIS — S60459A Superficial foreign body of unspecified finger, initial encounter: Secondary | ICD-10-CM

## 2020-06-26 DIAGNOSIS — Z23 Encounter for immunization: Secondary | ICD-10-CM

## 2020-06-26 DIAGNOSIS — L089 Local infection of the skin and subcutaneous tissue, unspecified: Secondary | ICD-10-CM | POA: Diagnosis not present

## 2020-06-26 DIAGNOSIS — L03012 Cellulitis of left finger: Secondary | ICD-10-CM | POA: Diagnosis not present

## 2020-06-26 DIAGNOSIS — M25551 Pain in right hip: Secondary | ICD-10-CM | POA: Diagnosis not present

## 2020-06-26 DIAGNOSIS — F3341 Major depressive disorder, recurrent, in partial remission: Secondary | ICD-10-CM | POA: Diagnosis not present

## 2020-06-26 MED ORDER — SULFAMETHOXAZOLE-TRIMETHOPRIM 800-160 MG PO TABS: 1.0000 | ORAL_TABLET | Freq: Two times a day (BID) | ORAL | 0 refills | Status: DC

## 2020-06-26 MED ORDER — BUPROPION HCL ER (XL) 300 MG PO TB24: 300.0000 mg | ORAL_TABLET | Freq: Every day | ORAL | 1 refills | Status: DC

## 2020-06-26 NOTE — Assessment & Plan Note (Signed)
Under good control on current regimen. Continue current regimen. Continue to monitor. Call with any concerns. Refills given.   

## 2020-06-26 NOTE — Progress Notes (Signed)
BP 131/86   Pulse 84   Temp 98.2 F (36.8 C)   Wt 186 lb 3.2 oz (84.5 kg)   SpO2 98%   BMI 27.30 kg/m    Subjective:    Patient ID: Wayne Robles, male    DOB: 01/30/1946, 75 y.o.   MRN: [id number]  HPI: Wayne Robles is a 75 y.o. male  Chief Complaint  Patient presents with  . Depression    Follow up   . Hip Pain    Patient states he is having right hip pain.   . Foreign Body in Skin    Patient states since Sunday he has had a splinter in his index finger, left finger. Area is red and swollen.    DEPRESSION Mood status: better Satisfied with current treatment?: yes Symptom severity: mild  Duration of current treatment : months Side effects: no Medication compliance: excellent compliance Psychotherapy/counseling: no  Previous psychiatric medications: wellbutrin Depressed mood: yes Anxious mood: yes Anhedonia: no Significant weight loss or gain: no Insomnia: no  Fatigue: yes Feelings of worthlessness or guilt: no Impaired concentration/indecisiveness: no Suicidal ideations: no Hopelessness: no Crying spells: no Depression screen [facility] 2/9 06/26/2020 05/29/2020 11/04/2016 11/04/2016  Decreased Interest 0 2 0 0  Down, Depressed, Hopeless 0 2 0 0  PHQ - 2 Score 0 4 0 0  Altered sleeping 1 3 0 -  Tired, decreased energy 0 0 0 -  Change in appetite 0 0 0 -  Feeling bad or failure about yourself  1 0 0 -  Trouble concentrating 1 1 0 -  Moving slowly or fidgety/restless 0 0 0 -  Suicidal thoughts 0 0 0 -  PHQ-9 Score 3 8 0 -  Difficult doing work/chores Not difficult at all Somewhat difficult - -  . HIP PAIN Duration: chronic, has been currently acting up for over a month Involved hip: right  Mechanism of injury: unknown Location: lateral Onset: gradual  Severity: moderate  Quality: throbbing and electric Frequency: intermittent Radiation: yes Aggravating factors: laying on it, walking, driving    Alleviating factors: aspirin, tylenol and ibuprofen    Status: better Treatments attempted: rest, APAP and ibuprofen   Relief with NSAIDs?: moderate Weakness with weight bearing: no Weakness with walking: no Paresthesias / decreased sensation: no Swelling: no Redness:no Fevers: no  Got a splinter in his L index finger a couple of days ago that he couldn't get out. It's now red and hot around the area and painful.   Relevant past medical, surgical, family and social history reviewed and updated as indicated. Interim medical history since our last visit reviewed. Allergies and medications reviewed and updated.  Review of Systems  Constitutional: Negative.   Respiratory: Negative.   Cardiovascular: Negative.   Gastrointestinal: Negative.   Musculoskeletal: Positive for arthralgias. Negative for back pain, gait problem, joint swelling, myalgias, neck pain and neck stiffness.  Skin: Negative.   Neurological: Negative.   Psychiatric/Behavioral: Negative.     Per HPI unless specifically indicated above     Objective:    BP 131/86   Pulse 84   Temp 98.2 F (36.8 C)   Wt 186 lb 3.2 oz (84.5 kg)   SpO2 98%   BMI 27.30 kg/m   Wt Readings from Last 3 Encounters:  06/26/20 186 lb 3.2 oz (84.5 kg)  05/29/20 188 lb 6.4 oz (85.5 kg)  11/04/16 184 lb 1 oz (83.5 kg)    Physical Exam Vitals and nursing note reviewed.  Constitutional:  General: He is not in acute distress.    Appearance: Normal appearance. He is not ill-appearing, toxic-appearing or diaphoretic.  HENT:     Head: Normocephalic and atraumatic.     Right Ear: External ear normal.     Left Ear: External ear normal.     Nose: Nose normal.     Mouth/Throat:     Mouth: Mucous membranes are moist.     Pharynx: Oropharynx is clear.  Eyes:     General: No scleral icterus.       Right eye: No discharge.        Left eye: No discharge.     Extraocular Movements: Extraocular movements intact.     Conjunctiva/sclera: Conjunctivae normal.     Pupils: Pupils are equal,  round, and reactive to light.  Cardiovascular:     Rate and Rhythm: Normal rate and regular rhythm.     Pulses: Normal pulses.     Heart sounds: Normal heart sounds. No murmur heard. No friction rub. No gallop.   Pulmonary:     Effort: Pulmonary effort is normal. No respiratory distress.     Breath sounds: Normal breath sounds. No stridor. No wheezing, rhonchi or rales.  Chest:     Chest wall: No tenderness.  Musculoskeletal:        General: Normal range of motion.     Cervical back: Normal range of motion and neck supple.  Skin:    General: Skin is warm and dry.     Capillary Refill: Capillary refill takes less than 2 seconds.     Coloration: Skin is not jaundiced or pale.     Findings: Erythema (area of red, hot skin at palmer DIP on L index finger with splinter ) present. No bruising, lesion or rash.  Neurological:     General: No focal deficit present.     Mental Status: He is alert and oriented to person, place, and time. Mental status is at baseline.  Psychiatric:        Mood and Affect: Mood normal.        Behavior: Behavior normal.        Thought Content: Thought content normal.        Judgment: Judgment normal.     Results for orders placed or performed in visit on 06/01/20  Testosterone, free, total([facility])  Result Value Ref Range   Testosterone 499 264 - 916 ng/dL   Testosterone, Free 5.1 (L) 6.6 - 18.1 pg/mL   Sex Hormone Binding 59.3 19.3 - 76.4 nmol/L      Assessment & Plan:   Problem List Items Addressed This Visit      Other   Depression - Primary    Under good control on current regimen. Continue current regimen. Continue to monitor. Call with any concerns. Refills given.        Relevant Medications   buPROPion (WELLBUTRIN XL) 300 MG 24 hr tablet    Other Visit Diagnoses    Right hip pain       Intermittent. Will check x-rays. Continue to monitor. Await results.    Relevant Orders   DG Lumbar Spine Complete   DG Hip Unilat W OR W/O  Pelvis 2-3 Views Right   Superficial foreign body (splinter) of fingers, without major open wound, infected, initial encounter       Removed today. Call with any concerns.    Relevant Medications   sulfamethoxazole-trimethoprim (BACTRIM DS) 800-160 MG tablet   Cellulitis of finger, left  Will treat with bactrim. Call if not getting better or gettin worse.       Skin Procedure  Procedure: Informed consent given.  Sterile prep of the area.  Area infiltrated with lidocaine without epinephrine.  Using a surgical blade, splinter removed from finger.  Area cauterized. Pt ed on scarring     Diagnosis:   ICD-10-CM   1. Recurrent major depressive disorder, in partial remission (HCC)  F33.41   2. Right hip pain  M25.551 DG Lumbar Spine Complete    DG Hip Unilat W OR W/O Pelvis 2-3 Views Right   Intermittent. Will check x-rays. Continue to monitor. Await results.   3. Superficial foreign body (splinter) of fingers, without major open wound, infected, initial encounter  S60.459A    L08.9    Removed today. Call with any concerns.   4. Cellulitis of finger, left  L03.012    Will treat with bactrim. Call if not getting better or gettin worse.     Lesion Location/Size: splinter in L index finger Physician: Wayne Robles Consent:  Risks, benefits, and alternative treatments discussed and all questions were answered.  Patient elected to proceed and verbal consent obtained.  Description: Area prepped and draped using semi-sterile technique. Area locally anesthetized using 3 cc's of lidocaine 1% plain. Splinter removed with scalpel and forceps. Adequate hemostastis achieved using electrocautery. Wound dressed with bandaid after application of triple antibiotic.  Post Procedure Instructions:  Wound care instructions discussed and patient was instructed to keep area clean and dry.  Signs and symptoms of infection discussed, patient agrees to contact the office ASAP should they occur.  Dressing change recommended  daily for 3 days.   Follow up plan: Return in about 6 months (around 12/27/2020).

## 2020-06-26 NOTE — Addendum Note (Signed)
Addended by: Pablo Ledger on: 06/26/2020 01:07 PM   Modules accepted: Orders

## 2020-08-11 ENCOUNTER — Encounter: Payer: Self-pay | Admitting: Family Medicine

## 2020-08-11 DIAGNOSIS — R7301 Impaired fasting glucose: Secondary | ICD-10-CM

## 2020-08-12 ENCOUNTER — Other Ambulatory Visit: Payer: Self-pay

## 2020-08-12 ENCOUNTER — Ambulatory Visit (HOSPITAL_BASED_OUTPATIENT_CLINIC_OR_DEPARTMENT_OTHER)
Admission: RE | Admit: 2020-08-12 | Discharge: 2020-08-12 | Disposition: A | Discharge status: Home / Self Care | Payer: Medicare HMO | Source: Ambulatory Visit | Attending: Family Medicine | Admitting: Family Medicine

## 2020-08-12 DIAGNOSIS — M25551 Pain in right hip: Secondary | ICD-10-CM | POA: Insufficient documentation

## 2020-08-12 DIAGNOSIS — M79604 Pain in right leg: Secondary | ICD-10-CM | POA: Diagnosis not present

## 2020-08-12 DIAGNOSIS — M47816 Spondylosis without myelopathy or radiculopathy, lumbar region: Secondary | ICD-10-CM | POA: Diagnosis not present

## 2020-08-14 ENCOUNTER — Encounter: Payer: Self-pay | Admitting: Family Medicine

## 2020-08-14 DIAGNOSIS — M25551 Pain in right hip: Secondary | ICD-10-CM

## 2020-08-26 DIAGNOSIS — M7061 Trochanteric bursitis, right hip: Secondary | ICD-10-CM | POA: Diagnosis not present

## 2020-08-26 DIAGNOSIS — M47896 Other spondylosis, lumbar region: Secondary | ICD-10-CM | POA: Diagnosis not present

## 2020-08-26 DIAGNOSIS — R52 Pain, unspecified: Secondary | ICD-10-CM | POA: Diagnosis not present

## 2020-09-06 ENCOUNTER — Encounter: Payer: Self-pay | Admitting: Family Medicine

## 2020-09-07 MED ORDER — EZETIMIBE 10 MG PO TABS: 10.0000 mg | ORAL_TABLET | Freq: Every day | ORAL | 1 refills | Status: DC

## 2020-09-10 ENCOUNTER — Other Ambulatory Visit: Payer: Self-pay | Admitting: Family Medicine

## 2020-09-10 NOTE — Telephone Encounter (Signed)
Requested medication (s) are due for refill today:   Looks like he is on Zetia now  last refill of this was 01/20/2017.  Requested medication (s) are on the active medication list:   Yes but not been active since 2018  Future visit scheduled:   Yes in 3 mo.   Last ordered: 01/20/2017 #90, 1 refill  Returned because not sure why this refill came up.   It looks like he is on Zetia now.     Requested Prescriptions  Pending Prescriptions Disp Refills   simvastatin (ZOCOR) 40 MG tablet [Pharmacy Med Name: SIMVASTATIN 40MG  TABLETS] 30 tablet     Sig: TAKE 1 TABLET BY MOUTH DAILY      Cardiovascular:  Antilipid - Statins Failed - 09/10/2020 10:15 AM      Failed - Triglycerides in normal range and within 360 days    Triglycerides  Date Value Ref Range Status  05/29/2020 293 (H) 0 - 149 mg/dL Final          Passed - Total Cholesterol in normal range and within 360 days    Cholesterol, Total  Date Value Ref Range Status  05/29/2020 188 100 - 199 mg/dL Final          Passed - LDL in normal range and within 360 days    LDL Chol Calc (NIH)  Date Value Ref Range Status  05/29/2020 94 0 - 99 mg/dL Final          Passed - HDL in normal range and within 360 days    HDL  Date Value Ref Range Status  05/29/2020 45 >39 mg/dL Final          Passed - Patient is not pregnant      Passed - Valid encounter within last 12 months    Recent Outpatient Visits           2 months ago Recurrent major depressive disorder, in partial remission (HCC)   [facility] [place], Megan P, DO   3 months ago Mixed hyperlipidemia   [facility] [place], Megan P, DO   3 years ago Recurrent major depressive disorder, in partial remission (HCC)   [facility] [place], Megan P, DO   7 years ago Other and unspecified hyperlipidemia   [facility], [facility], MD       Future Appointments             In 3 months Sandria Bales, Laural Benes, DO [facility]

## 2020-09-11 ENCOUNTER — Other Ambulatory Visit: Payer: Self-pay | Admitting: Family Medicine

## 2020-09-11 NOTE — Telephone Encounter (Signed)
Pharmacy called and spoke to [facility], who states she unsure why medication was being requested. Wayne Robles states it does not appear that the mediation was on auto refill. She states that she will cancel the request.

## 2020-09-15 ENCOUNTER — Other Ambulatory Visit: Payer: Self-pay | Admitting: Family Medicine

## 2020-09-15 ENCOUNTER — Encounter: Payer: Self-pay | Admitting: Family Medicine

## 2020-09-15 ENCOUNTER — Other Ambulatory Visit: Payer: Self-pay

## 2020-09-15 MED ORDER — SIMVASTATIN 40 MG PO TABS: 40.0000 mg | ORAL_TABLET | Freq: Every day | ORAL | 1 refills | Status: DC

## 2020-09-15 NOTE — Telephone Encounter (Signed)
Patient is requesting refill of simvastatin 40mg . Last appt 5/20, up coming appt 11/21.

## 2020-09-16 ENCOUNTER — Telehealth: Payer: Self-pay | Admitting: Family Medicine

## 2020-09-16 DIAGNOSIS — M5416 Radiculopathy, lumbar region: Secondary | ICD-10-CM | POA: Diagnosis not present

## 2020-09-16 NOTE — Telephone Encounter (Signed)
Copied from CRM [id number]. Topic: Medicare AWV >> Sep 16, 2020 10:00 AM Leigh Aurora wrote: Reason for CRM: Left message for patient to call back and schedule Medicare Annual Wellness Visit (AWV) to be done virtually or by telephone.  No hx of AWV eligible as of 02/07/17  Please schedule at anytime with [facility]-Nurse Health Advisor.      45 Minutes appointment   Any questions, please call me at [phone]

## 2020-10-09 ENCOUNTER — Telehealth: Payer: Self-pay

## 2020-10-09 ENCOUNTER — Other Ambulatory Visit: Payer: Self-pay

## 2020-10-09 ENCOUNTER — Ambulatory Visit (INDEPENDENT_AMBULATORY_CARE_PROVIDER_SITE_OTHER): Payer: Medicare HMO | Admitting: Nurse Practitioner

## 2020-10-09 ENCOUNTER — Encounter: Payer: Self-pay | Admitting: Nurse Practitioner

## 2020-10-09 VITALS — BP 161/85 | HR 58 | Temp 97.8°F | Wt 187.8 lb

## 2020-10-09 DIAGNOSIS — T1490XA Injury, unspecified, initial encounter: Secondary | ICD-10-CM | POA: Diagnosis not present

## 2020-10-09 DIAGNOSIS — R0781 Pleurodynia: Secondary | ICD-10-CM

## 2020-10-09 MED ORDER — TRAMADOL HCL 50 MG PO TABS: 50.0000 mg | ORAL_TABLET | Freq: Three times a day (TID) | ORAL | 0 refills | Status: AC | PRN

## 2020-10-09 NOTE — Telephone Encounter (Signed)
Copied from CRM [id number]. Topic: General - Other >> Oct 09, 2020 10:45 AM Traci Sermon wrote: Reason for CRM: Pt called in stating he wanted to speak with PCP nurse about him having bruised his rib yesterday, and needing some pain medication.   Patient would need an appointment to discuss and have medication prescribed. Please call to schedule.

## 2020-10-09 NOTE — Progress Notes (Signed)
Acute Office Visit  Subjective:    Patient ID: Wayne Robles, male    DOB: 01/24/1946, 75 y.o.   MRN: [id number]  Chief Complaint  Patient presents with   Rib Injury    Pt states he was using a shovel yesterday and the handle jammed into his R side. States he is not in much pain while sitting but really hurts when he moves.     HPI Patient is in today for rib injury while shoveling mulch from a truck. His shovel hit a screw and stopped suddenly, and he jammed the end of the shovel into his right ribs. He was ok yesterday until last night when he started hurting severely. Pain is a 9/10 and is sharp and tender to touch. Movement makes pain worse, along with coughing or laughing. He has tried tylenol, ibuprofen, and ice with no relief. He also takes meloxicam daily for hip pain. He denies bruising at this time.    Past Medical History:  Diagnosis Date   Depression    History of colonic polyps    Hyperlipidemia    Hyperthyroidism    Vitamin D deficiency     Past Surgical History:  Procedure Laterality Date   APPENDECTOMY     tonsillectomy      Family History  Problem Relation Age of Onset   Cancer Mother        Ovarian   Diabetes Father    Cancer Father        Prostate   Anxiety disorder Daughter     Social History   Socioeconomic History   Marital status: Married    Spouse name: Not on file   Number of children: Not on file   Years of education: Not on file   Highest education level: Not on file  Occupational History   Not on file  Tobacco Use   Smoking status: Never   Smokeless tobacco: Never  Vaping Use   Vaping Use: Never used  Substance and Sexual Activity   Alcohol use: No   Drug use: No   Sexual activity: Yes  Other Topics Concern   Not on file  Social History Narrative   Not on file   Social Determinants of Health   Financial Resource Strain: Not on file  Food Insecurity: Not on file  Transportation Needs: Not on file  Physical Activity:  Not on file  Stress: Not on file  Social Connections: Not on file  Intimate Partner Violence: Not on file    Outpatient Medications Prior to Visit  Medication Sig Dispense Refill   buPROPion (WELLBUTRIN XL) 300 MG 24 hr tablet Take 1 tablet (300 mg total) by mouth daily. 90 tablet 1   clonazePAM (KLONOPIN) 0.5 MG tablet Take 0.5 mg by mouth.     ezetimibe (ZETIA) 10 MG tablet Take 1 tablet (10 mg total) by mouth daily. 90 tablet 1   meloxicam (MOBIC) 15 MG tablet Take 15 mg by mouth daily.     Multiple Vitamin (MULTIVITAMIN) tablet Take 1 tablet by mouth daily.     simvastatin (ZOCOR) 40 MG tablet Take 1 tablet (40 mg total) by mouth daily. 90 tablet 1   sulfamethoxazole-trimethoprim (BACTRIM DS) 800-160 MG tablet Take 1 tablet by mouth 2 (two) times daily. 14 tablet 0   No facility-administered medications prior to visit.    No Known Allergies  Review of Systems  Constitutional: Negative.   Respiratory: Negative.    Cardiovascular: Negative.   Gastrointestinal: Negative.  Musculoskeletal:        Rib pain after injury  Skin: Negative.   Neurological: Negative.       Objective:    Physical Exam Vitals and nursing note reviewed.  Constitutional:      Appearance: Normal appearance.  HENT:     Head: Normocephalic.  Eyes:     Conjunctiva/sclera: Conjunctivae normal.  Cardiovascular:     Rate and Rhythm: Normal rate.     Pulses: Normal pulses.  Pulmonary:     Effort: Pulmonary effort is normal.  Musculoskeletal:        General: Tenderness present. Normal range of motion.     Cervical back: Normal range of motion.     Comments: Tenderness and swelling to right, anterior rib  Skin:    General: Skin is warm.  Neurological:     General: No focal deficit present.     Mental Status: He is alert and oriented to person, place, and time.  Psychiatric:        Mood and Affect: Mood normal.        Behavior: Behavior normal.        Thought Content: Thought content normal.         Judgment: Judgment normal.    BP (!) 161/85 (BP Location: Left Arm, Cuff Size: Normal)   Pulse (!) 58   Temp 97.8 F (36.6 C) (Oral)   Wt 187 lb 12.8 oz (85.2 kg)   SpO2 96%   BMI 27.53 kg/m  Wt Readings from Last 3 Encounters:  10/09/20 187 lb 12.8 oz (85.2 kg)  06/26/20 186 lb 3.2 oz (84.5 kg)  05/29/20 188 lb 6.4 oz (85.5 kg)    Health Maintenance Due  Topic Date Due   COVID-19 Vaccine (1) Never done   Hepatitis C Screening  Never done   PNA vac Low Risk Adult (1 of 2 - PCV13) Never done   Zoster Vaccines- Shingrix (2 of 2) 08/26/2020   INFLUENZA VACCINE  09/07/2020    There are no preventive care reminders to display for this patient.   Lab Results  Component Value Date   TSH 2.820 05/29/2020   Lab Results  Component Value Date   WBC 7.4 05/29/2020   HGB 16.8 05/29/2020   HCT 50.6 05/29/2020   MCV 94 05/29/2020   PLT 239 05/29/2020   Lab Results  Component Value Date   NA 138 05/29/2020   K 4.3 05/29/2020   CO2 20 05/29/2020   GLUCOSE 72 05/29/2020   BUN 14 05/29/2020   CREATININE 0.97 05/29/2020   BILITOT 1.1 05/29/2020   ALKPHOS 134 (H) 05/29/2020   AST 31 05/29/2020   ALT 32 05/29/2020   PROT 7.1 05/29/2020   ALBUMIN 4.6 05/29/2020   CALCIUM 9.5 05/29/2020   EGFR 83 05/29/2020   Lab Results  Component Value Date   CHOL 188 05/29/2020   Lab Results  Component Value Date   HDL 45 05/29/2020   Lab Results  Component Value Date   LDLCALC 94 05/29/2020   Lab Results  Component Value Date   TRIG 293 (H) 05/29/2020   Lab Results  Component Value Date   CHOLHDL 3.4 01/19/2013   Lab Results  Component Value Date   HGBA1C 5.9 05/29/2020       Assessment & Plan:   Problem List Items Addressed This Visit   None Visit Diagnoses     Rib pain    -  Primary   Will treat with 3  day supply of tramadol as needed for pain. PDMP reviewed. Discussed side effects. Don't drive while taking. Continue tylenol prn and ice 4xday   Injury        To ribs after accidentally hitting with end of shovel. Will treat for pain. Declines x-rays at this time. F/U if symptoms worsen or don't improve        Meds ordered this encounter  Medications   traMADol (ULTRAM) 50 MG tablet    Sig: Take 1 tablet (50 mg total) by mouth every 8 (eight) hours as needed for up to 5 days.    Dispense:  9 tablet    Refill:  0      Charyl Dancer, NP

## 2020-10-15 DIAGNOSIS — M47896 Other spondylosis, lumbar region: Secondary | ICD-10-CM | POA: Diagnosis not present

## 2020-10-15 DIAGNOSIS — M7061 Trochanteric bursitis, right hip: Secondary | ICD-10-CM | POA: Diagnosis not present

## 2020-11-10 DIAGNOSIS — H5201 Hypermetropia, right eye: Secondary | ICD-10-CM | POA: Diagnosis not present

## 2020-12-10 ENCOUNTER — Ambulatory Visit (INDEPENDENT_AMBULATORY_CARE_PROVIDER_SITE_OTHER): Payer: Medicare HMO | Admitting: Family Medicine

## 2020-12-10 ENCOUNTER — Encounter: Payer: Self-pay | Admitting: Family Medicine

## 2020-12-10 ENCOUNTER — Other Ambulatory Visit: Payer: Self-pay

## 2020-12-10 VITALS — BP 149/80 | HR 73 | Temp 97.6°F | Wt 187.6 lb

## 2020-12-10 DIAGNOSIS — R7301 Impaired fasting glucose: Secondary | ICD-10-CM | POA: Diagnosis not present

## 2020-12-10 DIAGNOSIS — Z23 Encounter for immunization: Secondary | ICD-10-CM | POA: Diagnosis not present

## 2020-12-10 DIAGNOSIS — F3341 Major depressive disorder, recurrent, in partial remission: Secondary | ICD-10-CM | POA: Diagnosis not present

## 2020-12-10 DIAGNOSIS — E782 Mixed hyperlipidemia: Secondary | ICD-10-CM | POA: Diagnosis not present

## 2020-12-10 LAB — BAYER DCA HB A1C WAIVED: HB A1C (BAYER DCA - WAIVED): 6 % — ABNORMAL HIGH (ref 4.8–5.6)

## 2020-12-10 MED ORDER — SIMVASTATIN 40 MG PO TABS: 40.0000 mg | ORAL_TABLET | Freq: Every day | ORAL | 1 refills | Status: DC

## 2020-12-10 MED ORDER — BUPROPION HCL ER (XL) 300 MG PO TB24: 300.0000 mg | ORAL_TABLET | Freq: Every day | ORAL | 1 refills | Status: DC

## 2020-12-10 MED ORDER — EZETIMIBE 10 MG PO TABS: 10.0000 mg | ORAL_TABLET | Freq: Every day | ORAL | 1 refills | Status: DC

## 2020-12-10 NOTE — Assessment & Plan Note (Signed)
Doing well with a1c of 6.0. Continue diet and exercise. Continue to monitor .

## 2020-12-10 NOTE — Assessment & Plan Note (Signed)
Under good control on current regimen. Continue current regimen. Continue to monitor. Call with any concerns. Refills given.   

## 2020-12-10 NOTE — Assessment & Plan Note (Signed)
Under good control on current regimen. Continue current regimen. Continue to monitor. Call with any concerns. Refills given. Labs drawn today.   

## 2020-12-10 NOTE — Progress Notes (Signed)
BP (!) 149/80   Pulse 73   Temp 97.6 F (36.4 C)   Wt 187 lb 9.6 oz (85.1 kg)   SpO2 96%   BMI 27.50 kg/m    Subjective:    Patient ID: Wayne Robles, male    DOB: 10/13/1945, 75 y.o.   MRN: [id number]  HPI: Wayne Robles is a 75 y.o. male  Chief Complaint  Patient presents with   IFG    Depression   Hyperlipidemia   Impaired Fasting Glucose HbA1C:  Lab Results  Component Value Date   HGBA1C 5.9 05/29/2020   Duration of elevated blood sugar: chronic Polydipsia: no Polyuria: no Weight change: no Visual disturbance: no Glucose Monitoring: no Diabetic Education: Not Completed Family history of diabetes: yes  HYPERLIPIDEMIA Hyperlipidemia status: excellent compliance Satisfied with current treatment?  no Side effects:  no Medication compliance: excellent compliance Past cholesterol meds: simvastatin, zetia Supplements: none Aspirin:  no The 10-year ASCVD risk score (Arnett DK, et al., 2019) is: 26.4%   Values used to calculate the score:     Age: 24 years     Sex: Male     Is Non-Hispanic African American: No     Diabetic: No     Tobacco smoker: No     Systolic Blood Pressure: 123456 mmHg     Is BP treated: No     HDL Cholesterol: 45 mg/dL     Total Cholesterol: 188 mg/dL Chest pain:  no  DEPRESSION Mood status: exacerbated Satisfied with current treatment?: yes Symptom severity: mild  Duration of current treatment : chronic Side effects: no Medication compliance: excellent compliance Psychotherapy/counseling: yes  Previous psychiatric medications: wellbutrin, klonopin Depressed mood: yes Anxious mood: yes Anhedonia: no Significant weight loss or gain: no Insomnia: no  Fatigue: yes Feelings of worthlessness or guilt: no Impaired concentration/indecisiveness: no Suicidal ideations: no Hopelessness: no Crying spells: no Depression screen [facility] 2/9 12/10/2020 06/26/2020 05/29/2020 11/04/2016 11/04/2016  Decreased Interest 0 0 2 0 0  Down,  Depressed, Hopeless 0 0 2 0 0  PHQ - 2 Score 0 0 4 0 0  Altered sleeping 0 1 3 0 -  Tired, decreased energy 0 0 0 0 -  Change in appetite 0 0 0 0 -  Feeling bad or failure about yourself  1 1 0 0 -  Trouble concentrating 0 1 1 0 -  Moving slowly or fidgety/restless 0 0 0 0 -  Suicidal thoughts 0 0 0 0 -  PHQ-9 Score 1 3 8  0 -  Difficult doing work/chores - Not difficult at all Somewhat difficult - -    Relevant past medical, surgical, family and social history reviewed and updated as indicated. Interim medical history since our last visit reviewed. Allergies and medications reviewed and updated.  Review of Systems  Constitutional: Negative.   HENT:  Positive for sneezing. Negative for congestion, dental problem, drooling, ear discharge, ear pain, facial swelling, hearing loss, mouth sores, nosebleeds, postnasal drip, rhinorrhea, sinus pressure, sinus pain, sore throat, tinnitus, trouble swallowing and voice change.   Respiratory: Negative.    Cardiovascular: Negative.   Gastrointestinal: Negative.   Psychiatric/Behavioral: Negative.     Per HPI unless specifically indicated above     Objective:    BP (!) 149/80   Pulse 73   Temp 97.6 F (36.4 C)   Wt 187 lb 9.6 oz (85.1 kg)   SpO2 96%   BMI 27.50 kg/m   Wt Readings from Last 3 Encounters:  12/10/20 187 lb 9.6 oz (85.1 kg)  10/09/20 187 lb 12.8 oz (85.2 kg)  06/26/20 186 lb 3.2 oz (84.5 kg)    Physical Exam Vitals and nursing note reviewed.  Constitutional:      General: He is not in acute distress.    Appearance: Normal appearance. He is not ill-appearing, toxic-appearing or diaphoretic.  HENT:     Head: Normocephalic and atraumatic.     Right Ear: External ear normal.     Left Ear: External ear normal.     Nose: Nose normal.     Mouth/Throat:     Mouth: Mucous membranes are moist.     Pharynx: Oropharynx is clear.  Eyes:     General: No scleral icterus.       Right eye: No discharge.        Left eye: No  discharge.     Extraocular Movements: Extraocular movements intact.     Conjunctiva/sclera: Conjunctivae normal.     Pupils: Pupils are equal, round, and reactive to light.  Cardiovascular:     Rate and Rhythm: Normal rate and regular rhythm.     Pulses: Normal pulses.     Heart sounds: Normal heart sounds. No murmur heard.   No friction rub. No gallop.  Pulmonary:     Effort: Pulmonary effort is normal. No respiratory distress.     Breath sounds: Normal breath sounds. No stridor. No wheezing, rhonchi or rales.  Chest:     Chest wall: No tenderness.  Musculoskeletal:        General: Normal range of motion.     Cervical back: Normal range of motion and neck supple.  Skin:    General: Skin is warm and dry.     Capillary Refill: Capillary refill takes less than 2 seconds.     Coloration: Skin is not jaundiced or pale.     Findings: No bruising, erythema, lesion or rash.  Neurological:     General: No focal deficit present.     Mental Status: He is alert and oriented to person, place, and time. Mental status is at baseline.  Psychiatric:        Mood and Affect: Mood normal.        Behavior: Behavior normal.        Thought Content: Thought content normal.        Judgment: Judgment normal.    Results for orders placed or performed in visit on 08/11/20  VITAMIN D 25 Hydroxy (Vit-D Deficiency, Fractures)  Result Value Ref Range   Vit D, 25-Hydroxy 24.64   Basic metabolic panel  Result Value Ref Range   Glucose 85    BUN 21 4 - 21   CO2 30 (A) 13 - 22   Creatinine 0.9 0.6 - 1.3   Potassium 4.4 3.4 - 5.3   Sodium 136 (A) 137 - 147   Chloride 102 99 - 108  Comprehensive metabolic panel  Result Value Ref Range   Globulin 2.6    GFR calc non Af Amer 83    Calcium 9.3 8.7 - 10.7   Albumin 4.6 3.5 - 5.0  Lipid panel  Result Value Ref Range   Triglycerides 363 (A) 40 - 160   Cholesterol 256 (A) 0 - 200   HDL 39 35 - 70   LDL Cholesterol 144   Hepatic function panel  Result  Value Ref Range   Alkaline Phosphatase 119 25 - 125   ALT 33 10 - 40  AST 32 14 - 40   Bilirubin, Total 1.2   Hemoglobin A1c  Result Value Ref Range   Hemoglobin A1C 6.0   TSH  Result Value Ref Range   TSH 2.27 0.41 - 5.90  HM COLONOSCOPY  Result Value Ref Range   HM Colonoscopy See Report (in chart) See Report (in chart), Patient Reported      Assessment & Plan:   Problem List Items Addressed This Visit       Endocrine   IFG (impaired fasting glucose) - Primary    Doing well with a1c of 6.0. Continue diet and exercise. Continue to monitor .      Relevant Orders   Bayer DCA Hb A1c Waived   Comprehensive metabolic panel     Other   Depression    Under good control on current regimen. Continue current regimen. Continue to monitor. Call with any concerns. Refills given.        Relevant Medications   buPROPion (WELLBUTRIN XL) 300 MG 24 hr tablet   Other Relevant Orders   CBC with Differential/Platelet   Comprehensive metabolic panel   Hyperlipidemia    Under good control on current regimen. Continue current regimen. Continue to monitor. Call with any concerns. Refills given. Labs drawn today.       Relevant Medications   ezetimibe (ZETIA) 10 MG tablet   simvastatin (ZOCOR) 40 MG tablet   Other Relevant Orders   Comprehensive metabolic panel   Lipid Panel w/o Chol/HDL Ratio   Other Visit Diagnoses     Needs flu shot       Relevant Orders   Flu Vaccine QUAD High Dose(Fluad) (Completed)        Follow up plan: Return in about 6 months (around 06/09/2021).

## 2020-12-10 NOTE — Patient Instructions (Signed)
Get your shot record from the [place] and your COVID card (send me a picture)

## 2020-12-11 LAB — COMPREHENSIVE METABOLIC PANEL
ALT: 37 IU/L (ref 0–44)
AST: 32 IU/L (ref 0–40)
Albumin/Globulin Ratio: 2.2 (ref 1.2–2.2)
Albumin: 4.6 g/dL (ref 3.7–4.7)
Alkaline Phosphatase: 124 IU/L — ABNORMAL HIGH (ref 44–121)
BUN/Creatinine Ratio: 17 (ref 10–24)
BUN: 17 mg/dL (ref 8–27)
Bilirubin Total: 0.9 mg/dL (ref 0.0–1.2)
CO2: 25 mmol/L (ref 20–29)
Calcium: 9.6 mg/dL (ref 8.6–10.2)
Chloride: 102 mmol/L (ref 96–106)
Creatinine, Ser: 1.02 mg/dL (ref 0.76–1.27)
Globulin, Total: 2.1 g/dL (ref 1.5–4.5)
Glucose: 92 mg/dL (ref 70–99)
Potassium: 4.1 mmol/L (ref 3.5–5.2)
Sodium: 140 mmol/L (ref 134–144)
Total Protein: 6.7 g/dL (ref 6.0–8.5)
eGFR: 78 mL/min/{1.73_m2} (ref >59)

## 2020-12-11 LAB — CBC WITH DIFFERENTIAL/PLATELET
Basophils Absolute: 0 10*3/uL (ref 0.0–0.2)
Basos: 0 %
EOS (ABSOLUTE): 0.1 10*3/uL (ref 0.0–0.4)
Eos: 1 %
Hematocrit: 50.7 % (ref 37.5–51.0)
Hemoglobin: 16.8 g/dL (ref 13.0–17.7)
Immature Grans (Abs): 0 10*3/uL (ref 0.0–0.1)
Immature Granulocytes: 0 %
Lymphocytes Absolute: 1.8 10*3/uL (ref 0.7–3.1)
Lymphs: 27 %
MCH: 31.4 pg (ref 26.6–33.0)
MCHC: 33.1 g/dL (ref 31.5–35.7)
MCV: 95 fL (ref 79–97)
Monocytes Absolute: 0.6 10*3/uL (ref 0.1–0.9)
Monocytes: 10 %
Neutrophils Absolute: 4.1 10*3/uL (ref 1.4–7.0)
Neutrophils: 62 %
Platelets: 208 10*3/uL (ref 150–450)
RBC: 5.35 x10E6/uL (ref 4.14–5.80)
RDW: 12.7 % (ref 11.6–15.4)
WBC: 6.6 10*3/uL (ref 3.4–10.8)

## 2020-12-11 LAB — LIPID PANEL W/O CHOL/HDL RATIO
Cholesterol, Total: 159 mg/dL (ref 100–199)
HDL: 44 mg/dL (ref >39)
LDL Chol Calc (NIH): 78 mg/dL (ref 0–99)
Triglycerides: 223 mg/dL — ABNORMAL HIGH (ref 0–149)
VLDL Cholesterol Cal: 37 mg/dL (ref 5–40)

## 2020-12-28 ENCOUNTER — Ambulatory Visit: Payer: Medicare HMO | Admitting: Family Medicine

## 2021-01-12 ENCOUNTER — Ambulatory Visit (INDEPENDENT_AMBULATORY_CARE_PROVIDER_SITE_OTHER): Payer: Medicare HMO | Admitting: Nurse Practitioner

## 2021-01-12 ENCOUNTER — Other Ambulatory Visit: Payer: Self-pay

## 2021-01-12 ENCOUNTER — Encounter: Payer: Self-pay | Admitting: Nurse Practitioner

## 2021-01-12 VITALS — BP 144/88 | HR 71 | Temp 97.8°F | Ht 69.25 in | Wt 186.0 lb

## 2021-01-12 DIAGNOSIS — J0100 Acute maxillary sinusitis, unspecified: Secondary | ICD-10-CM

## 2021-01-12 LAB — VERITOR FLU A/B WAIVED
Influenza A: NEGATIVE
Influenza B: NEGATIVE

## 2021-01-12 MED ORDER — PREDNISONE 10 MG PO TABS: ORAL_TABLET | ORAL | 0 refills | Status: DC

## 2021-01-12 MED ORDER — AMOXICILLIN-POT CLAVULANATE 875-125 MG PO TABS: 1.0000 | ORAL_TABLET | Freq: Two times a day (BID) | ORAL | 0 refills | Status: DC

## 2021-01-12 NOTE — Progress Notes (Signed)
Acute Office Visit  Subjective:    Patient ID: Wayne Robles, male    DOB: 04/28/1945, 75 y.o.   MRN: [id number]  Chief Complaint  Patient presents with   Sinus pressure    Has had all symptoms for the past 3 weeks. Has taken 2 covid test last week which were negative.    Fatigue   Headache   Generalized Body Aches    HPI Patient is in today for fatigue, body aches, and sinus pressure that have been fluctuating over the past 3 weeks since a trip to [place]. He has had 2 negative covid-19 tests at home.   UPPER RESPIRATORY TRACT INFECTION  Worst symptom: sinus pressure Fever: no Cough: no Shortness of breath: yes - intermittent Wheezing: no Chest pain: no Chest tightness: no Chest congestion: no Nasal congestion: yes Runny nose: yes Post nasal drip: no Sneezing: yes Sore throat: no Swollen glands: no Sinus pressure: yes Headache: yes Face pain: no Toothache: no Ear pain: no  Ear pressure: no  Eyes red/itching:no Eye drainage/crusting: no  Vomiting: no Rash: no Fatigue: yes Sick contacts: no Strep contacts: no  Context: fluctuating Recurrent sinusitis: no Relief with OTC cold/cough medications: yes  Treatments attempted: theraflu, tylenol/ibuprofen    Past Medical History:  Diagnosis Date   Depression    History of colonic polyps    Hyperlipidemia    Hyperthyroidism    Vitamin D deficiency     Past Surgical History:  Procedure Laterality Date   APPENDECTOMY     tonsillectomy      Family History  Problem Relation Age of Onset   Cancer Mother        Ovarian   Diabetes Father    Cancer Father        Prostate   Anxiety disorder Daughter     Social History   Socioeconomic History   Marital status: Married    Spouse name: Not on file   Number of children: Not on file   Years of education: Not on file   Highest education level: Not on file  Occupational History   Not on file  Tobacco Use   Smoking status: Never   Smokeless tobacco:  Never  Vaping Use   Vaping Use: Never used  Substance and Sexual Activity   Alcohol use: No   Drug use: No   Sexual activity: Yes  Other Topics Concern   Not on file  Social History Narrative   Not on file   Social Determinants of Health   Financial Resource Strain: Not on file  Food Insecurity: Not on file  Transportation Needs: Not on file  Physical Activity: Not on file  Stress: Not on file  Social Connections: Not on file  Intimate Partner Violence: Not on file    Outpatient Medications Prior to Visit  Medication Sig Dispense Refill   buPROPion (WELLBUTRIN XL) 300 MG 24 hr tablet Take 1 tablet (300 mg total) by mouth daily. 90 tablet 1   ezetimibe (ZETIA) 10 MG tablet Take 1 tablet (10 mg total) by mouth daily. 90 tablet 1   meloxicam (MOBIC) 15 MG tablet Take 15 mg by mouth daily.     Multiple Vitamin (MULTIVITAMIN) tablet Take 1 tablet by mouth daily.     simvastatin (ZOCOR) 40 MG tablet Take 1 tablet (40 mg total) by mouth daily. 90 tablet 1   No facility-administered medications prior to visit.    No Known Allergies  Review of Systems  Constitutional:  Positive for fatigue. Negative for fever.  HENT:  Positive for congestion, rhinorrhea and sneezing. Negative for postnasal drip, sinus pressure and sore throat.   Eyes: Negative.   Respiratory:  Positive for shortness of breath. Negative for cough.   Cardiovascular: Negative.   Gastrointestinal:  Positive for nausea. Negative for abdominal pain.  Endocrine: Negative.   Genitourinary: Negative.   Musculoskeletal:  Positive for myalgias.  Skin: Negative.   Neurological:  Positive for headaches. Negative for dizziness.  Psychiatric/Behavioral: Negative.        Objective:    Physical Exam Vitals and nursing note reviewed.  Constitutional:      Appearance: Normal appearance.  HENT:     Head: Normocephalic.     Right Ear: Tympanic membrane, ear canal and external ear normal.     Left Ear: Tympanic membrane,  ear canal and external ear normal.  Eyes:     Conjunctiva/sclera: Conjunctivae normal.  Cardiovascular:     Rate and Rhythm: Normal rate and regular rhythm.     Pulses: Normal pulses.     Heart sounds: Normal heart sounds.  Pulmonary:     Effort: Pulmonary effort is normal.     Breath sounds: Normal breath sounds.  Musculoskeletal:     Cervical back: Normal range of motion and neck supple. No tenderness.  Lymphadenopathy:     Cervical: No cervical adenopathy.  Skin:    General: Skin is warm.  Neurological:     General: No focal deficit present.     Mental Status: He is alert and oriented to person, place, and time.  Psychiatric:        Mood and Affect: Mood normal.        Behavior: Behavior normal.        Thought Content: Thought content normal.        Judgment: Judgment normal.    BP (!) 144/88   Pulse 71   Temp 97.8 F (36.6 C) (Oral)   Ht 5' 9.25" (1.759 m)   Wt 186 lb (84.4 kg)   SpO2 96%   BMI 27.27 kg/m  Wt Readings from Last 3 Encounters:  01/12/21 186 lb (84.4 kg)  12/10/20 187 lb 9.6 oz (85.1 kg)  10/09/20 187 lb 12.8 oz (85.2 kg)    Health Maintenance Due  Topic Date Due   Pneumonia Vaccine 57+ Years old (1 - PCV) Never done    There are no preventive care reminders to display for this patient.   Lab Results  Component Value Date   TSH 2.820 05/29/2020   Lab Results  Component Value Date   WBC 6.6 12/10/2020   HGB 16.8 12/10/2020   HCT 50.7 12/10/2020   MCV 95 12/10/2020   PLT 208 12/10/2020   Lab Results  Component Value Date   NA 140 12/10/2020   K 4.1 12/10/2020   CO2 25 12/10/2020   GLUCOSE 92 12/10/2020   BUN 17 12/10/2020   CREATININE 1.02 12/10/2020   BILITOT 0.9 12/10/2020   ALKPHOS 124 (H) 12/10/2020   AST 32 12/10/2020   ALT 37 12/10/2020   PROT 6.7 12/10/2020   ALBUMIN 4.6 12/10/2020   CALCIUM 9.6 12/10/2020   EGFR 78 12/10/2020   Lab Results  Component Value Date   CHOL 159 12/10/2020   Lab Results  Component  Value Date   HDL 44 12/10/2020   Lab Results  Component Value Date   LDLCALC 78 12/10/2020   Lab Results  Component Value Date   TRIG  223 (H) 12/10/2020   Lab Results  Component Value Date   CHOLHDL 3.4 01/19/2013   Lab Results  Component Value Date   HGBA1C 6.0 (H) 12/10/2020       Assessment & Plan:   Problem List Items Addressed This Visit   None Visit Diagnoses     Acute non-recurrent maxillary sinusitis    -  Primary   Flu negative (pt requested test). Treat with augmentin and prednisone taper. Continue rest, fluids, theraflu/ibuprofen. F/U if symptoms not improving.    Relevant Medications   predniSONE (DELTASONE) 10 MG tablet   amoxicillin-clavulanate (AUGMENTIN) 875-125 MG tablet   Other Relevant Orders   Veritor Flu A/B Waived        Meds ordered this encounter  Medications   predniSONE (DELTASONE) 10 MG tablet    Sig: Take 6 tablets today, then 5 tablets tomorrow, then decrease by 1 tablet every day until gone    Dispense:  21 tablet    Refill:  0   amoxicillin-clavulanate (AUGMENTIN) 875-125 MG tablet    Sig: Take 1 tablet by mouth 2 (two) times daily.    Dispense:  20 tablet    Refill:  0     Charyl Dancer, NP

## 2021-01-15 ENCOUNTER — Encounter: Payer: Self-pay | Admitting: Nurse Practitioner

## 2021-02-11 ENCOUNTER — Ambulatory Visit: Payer: Medicare HMO

## 2021-02-11 ENCOUNTER — Encounter: Payer: Self-pay | Admitting: Family Medicine

## 2021-02-12 ENCOUNTER — Ambulatory Visit: Payer: Medicare HMO

## 2021-02-17 ENCOUNTER — Ambulatory Visit (INDEPENDENT_AMBULATORY_CARE_PROVIDER_SITE_OTHER): Payer: Medicare HMO | Admitting: *Deleted

## 2021-02-17 DIAGNOSIS — Z0000 Encounter for general adult medical examination without abnormal findings: Secondary | ICD-10-CM | POA: Diagnosis not present

## 2021-02-17 NOTE — Progress Notes (Signed)
Subjective:   [name] is a 76 y.o. male who presents for Medicare Annual/Subsequent preventive examination.  I connected with  Wayne Robles on 02/17/21 by a telephone enabled telemedicine application and verified that I am speaking with the correct person using two identifiers.   I discussed the limitations of evaluation and management by telemedicine. The patient expressed understanding and agreed to proceed.  Patient location: home  Provider location: Tele-Health not in office    Review of Systems     Cardiac Risk Factors include: advanced age (>40men, >60 women);sedentary lifestyle;male gender     Objective:    Today's Vitals   There is no height or weight on file to calculate BMI.  Advanced Directives 02/17/2021  Does Patient Have a Medical Advance Directive? No  Would patient like information on creating a medical advance directive? No - Patient declined    Current Medications (verified) Outpatient Encounter Medications as of 02/17/2021  Medication Sig   buPROPion (WELLBUTRIN XL) 300 MG 24 hr tablet Take 1 tablet (300 mg total) by mouth daily.   ezetimibe (ZETIA) 10 MG tablet Take 1 tablet (10 mg total) by mouth daily.   meloxicam (MOBIC) 15 MG tablet Take 15 mg by mouth daily.   Multiple Vitamin (MULTIVITAMIN) tablet Take 1 tablet by mouth daily.   simvastatin (ZOCOR) 40 MG tablet Take 1 tablet (40 mg total) by mouth daily.   amoxicillin-clavulanate (AUGMENTIN) 875-125 MG tablet Take 1 tablet by mouth 2 (two) times daily.   predniSONE (DELTASONE) 10 MG tablet Take 6 tablets today, then 5 tablets tomorrow, then decrease by 1 tablet every day until gone   No facility-administered encounter medications on file as of 02/17/2021.    Allergies (verified) Patient has no known allergies.   History: Past Medical History:  Diagnosis Date   Depression    History of colonic polyps    Hyperlipidemia    Hyperthyroidism    Vitamin D deficiency    Past  Surgical History:  Procedure Laterality Date   APPENDECTOMY     tonsillectomy     Family History  Problem Relation Age of Onset   Cancer Mother        Ovarian   Diabetes Father    Cancer Father        Prostate   Anxiety disorder Daughter    Social History   Socioeconomic History   Marital status: Married    Spouse name: Not on file   Number of children: Not on file   Years of education: Not on file   Highest education level: Not on file  Occupational History   Not on file  Tobacco Use   Smoking status: Never   Smokeless tobacco: Never  Vaping Use   Vaping Use: Never used  Substance and Sexual Activity   Alcohol use: No   Drug use: No   Sexual activity: Yes  Other Topics Concern   Not on file  Social History Narrative   Not on file   Social Determinants of Health   Financial Resource Strain: Low Risk    Difficulty of Paying Living Expenses: Not hard at all  Food Insecurity: No Food Insecurity   Worried About Charity fundraiser in the Last Year: Never true   [place] in the Last Year: Never true  Transportation Needs: No Transportation Needs   Lack of Transportation (Medical): No   Lack of Transportation (Non-Medical): No  Physical Activity: Inactive   Days of  Exercise per Week: 0 days   Minutes of Exercise per Session: 0 min  Stress: No Stress Concern Present   Feeling of Stress : Not at all  Social Connections: Moderately Integrated   Frequency of Communication with Friends and Family: Three times a week   Frequency of Social Gatherings with Friends and Family: Twice a week   Attends Religious Services: Never   Marine scientist or Organizations: No   Attends Music therapist: 1 to 4 times per year   Marital Status: Married    Tobacco Counseling Counseling given: Not Answered   Clinical Intake:  Pre-visit preparation completed: Yes  Pain : No/denies pain     Nutritional Risks: None Diabetes: No  How often do you  need to have someone help you when you read instructions, pamphlets, or other written materials from your doctor or pharmacy?: 1 - Never  Diabetic?  no  Interpreter Needed?: No  Information entered by :: Leroy Kennedy LPN   Activities of Daily Living In your present state of health, do you have any difficulty performing the following activities: 02/17/2021 05/29/2020  Hearing? Y N  Vision? N N  Difficulty concentrating or making decisions? N Y  Walking or climbing stairs? N N  Dressing or bathing? N N  Doing errands, shopping? N N  Preparing Food and eating ? N -  Using the Toilet? N -  In the past six months, have you accidently leaked urine? N -  Do you have problems with loss of bowel control? N -  Managing your Medications? N -  Managing your Finances? N -  Housekeeping or managing your Housekeeping? N -  Some recent data might be hidden    Patient Care Team: Valerie Roys, DO as PCP - General (Family Medicine)  Indicate any recent Medical Services you may have received from other than [facility] providers in the past year (date may be approximate).     Assessment:   This is a routine wellness examination for Wayne Robles.  Hearing/Vision screen Hearing Screening - Comments:: Bilateral hearing aids Vision Screening - Comments:: Up to date [facility]  Dietary issues and exercise activities discussed: Current Exercise Habits: The patient does not participate in regular exercise at present, Exercise limited by: None identified   Goals Addressed             This Visit's Progress    Patient Stated       Increase physical activity       Depression Screen PHQ 2/9 Scores 02/17/2021 01/12/2021 12/10/2020 06/26/2020 05/29/2020 11/04/2016 11/04/2016  PHQ - 2 Score 1 2 0 0 4 0 0  PHQ- 9 Score 6 7 1 3 8  0 -    Fall Risk Fall Risk  02/17/2021 01/12/2021 05/29/2020 11/04/2016  Falls in the past year? 0 0 0 No  Number falls in past yr: 0 0 0 -  Injury with Fall? 0 0 0 -  Risk for  fall due to : - No Fall Risks No Fall Risks -  Follow up Falls evaluation completed;Falls prevention discussed Falls evaluation completed Falls evaluation completed -    FALL RISK PREVENTION PERTAINING TO THE HOME:  Any stairs in or around the home? Yes  If so, are there any without handrails? No  Home free of loose throw rugs in walkways, pet beds, electrical cords, etc? Yes  Adequate lighting in your home to reduce risk of falls? Yes   ASSISTIVE DEVICES UTILIZED TO PREVENT  FALLS:  Life alert? No  Use of a cane, walker or w/c? No  Grab bars in the bathroom? No  Shower chair or bench in shower? No  Elevated toilet seat or a handicapped toilet? No   TIMED UP AND GO:  Was the test performed? No .    Cognitive Function:  Normal cognitive status assessed by direct observation by this Nurse Health Advisor. No abnormalities found.          Immunizations Immunization History  Administered Date(s) Administered   Fluad Quad(high Dose 65+) 12/10/2020   Influenza, High Dose Seasonal PF 11/04/2016   Influenza-Unspecified 11/08/2019   PFIZER(Purple Top)SARS-COV-2 Vaccination 04/11/2019, 05/02/2019, 02/13/2020   Pfizer Covid-19 Vaccine Bivalent Booster 5y-11y 12/07/2020   Td 06/26/2020   Tdap 06/22/2020   Zoster Recombinat (Shingrix) 07/01/2020, 10/15/2020    TDAP status: Up to date  Flu Vaccine status: Up to date  Pneumococcal vaccine status: Due, Education has been provided regarding the importance of this vaccine. Advised may receive this vaccine at local pharmacy or Health Dept. Aware to provide a copy of the vaccination record if obtained from local pharmacy or Health Dept. Verbalized acceptance and understanding.  Covid-19 vaccine status: Information provided on how to obtain vaccines.   Qualifies for Shingles Vaccine? No   Zostavax completed No   Shingrix Completed?: Yes  Screening Tests Health Maintenance  Topic Date Due   Pneumonia Vaccine 13+ Years old (1 - PCV)  Never done   Hepatitis C Screening  01/12/2022 (Originally 01/21/1964)   COLONOSCOPY (Pts 45-58yrs Insurance coverage will need to be confirmed)  02/24/2025   TETANUS/TDAP  06/27/2030   INFLUENZA VACCINE  Completed   COVID-19 Vaccine  Completed   Zoster Vaccines- Shingrix  Completed   HPV VACCINES  Aged Out    Health Maintenance  Health Maintenance Due  Topic Date Due   Pneumonia Vaccine 18+ Years old (1 - PCV) Never done    Colorectal cancer screening: Type of screening: Colonoscopy. Completed 2022. Repeat every 5 years  Lung Cancer Screening: (Low Dose CT Chest recommended if Age 43-80 years, 30 pack-year currently smoking OR have quit w/in 15years.) does not qualify.   Lung Cancer Screening Referral:   Additional Screening:  Hepatitis C Screening: does not qualify;   Vision Screening: Recommended annual ophthalmology exams for early detection of glaucoma and other disorders of the eye. Is the patient up to date with their annual eye exam?  Yes  Who is the provider or what is the name of the office in which the patient attends annual eye exams? [facility] If pt is not established with a provider, would they like to be referred to a provider to establish care? No .   Dental Screening: Recommended annual dental exams for proper oral hygiene  Community Resource Referral / Chronic Care Management: CRR required this visit?  No   CCM required this visit?  No      Plan:     I have personally reviewed and noted the following in the patients chart:   Medical and social history Use of alcohol, tobacco or illicit drugs  Current medications and supplements including opioid prescriptions. Patient is not currently taking opioid prescriptions. Functional ability and status Nutritional status Physical activity Advanced directives List of other physicians Hospitalizations, surgeries, and ER visits in previous 12 months Vitals Screenings to include cognitive, depression,  and falls Referrals and appointments  In addition, I have reviewed and discussed with patient certain preventive protocols, quality metrics,  and best practice recommendations. A written personalized care plan for preventive services as well as general preventive health recommendations were provided to patient.     Leroy Kennedy, LPN   [id number]   Nurse Notes:

## 2021-02-17 NOTE — Patient Instructions (Addendum)
Mr. Wayne Robles , Thank you for taking time to come for your Medicare Wellness Visit. I appreciate your ongoing commitment to your health goals. Please review the following plan we discussed and let me know if I can assist you in the future.   Screening recommendations/referrals: Colonoscopy: up to date Recommended yearly ophthalmology/optometry visit for glaucoma screening and checkup Recommended yearly dental visit for hygiene and checkup  Vaccinations: Influenza vaccine: up to date Pneumococcal vaccine: Education provided Tdap vaccine: up to date Shingles vaccine: up to date    Advanced directives: Education provided  Conditions/risks identified:   Next appointment: 06-09-2021 @ 8:40  [facility] 74 Years and Older, Male Preventive care refers to lifestyle choices and visits with your health care provider that can promote health and wellness. What does preventive care include? A yearly physical exam. This is also called an annual well check. Dental exams once or twice a year. Routine eye exams. Ask your health care provider how often you should have your eyes checked. Personal lifestyle choices, including: Daily care of your teeth and gums. Regular physical activity. Eating a healthy diet. Avoiding tobacco and drug use. Limiting alcohol use. Practicing safe sex. Taking low doses of aspirin every day. Taking vitamin and mineral supplements as recommended by your health care provider. What happens during an annual well check? The services and screenings done by your health care provider during your annual well check will depend on your age, overall health, lifestyle risk factors, and family history of disease. Counseling  Your health care provider may ask you questions about your: Alcohol use. Tobacco use. Drug use. Emotional well-being. Home and relationship well-being. Sexual activity. Eating habits. History of falls. Memory and ability to understand  (cognition). Work and work Statistician. Screening  You may have the following tests or measurements: Height, weight, and BMI. Blood pressure. Lipid and cholesterol levels. These may be checked every 5 years, or more frequently if you are over 56 years old. Skin check. Lung cancer screening. You may have this screening every year starting at age 69 if you have a 30-pack-year history of smoking and currently smoke or have quit within the past 15 years. Fecal occult blood test (FOBT) of the stool. You may have this test every year starting at age 51. Flexible sigmoidoscopy or colonoscopy. You may have a sigmoidoscopy every 5 years or a colonoscopy every 10 years starting at age 29. Prostate cancer screening. Recommendations will vary depending on your family history and other risks. Hepatitis C blood test. Hepatitis B blood test. Sexually transmitted disease (STD) testing. Diabetes screening. This is done by checking your blood sugar (glucose) after you have not eaten for a while (fasting). You may have this done every 1-3 years. Abdominal aortic aneurysm (AAA) screening. You may need this if you are a current or former smoker. Osteoporosis. You may be screened starting at age 56 if you are at high risk. Talk with your health care provider about your test results, treatment options, and if necessary, the need for more tests. Vaccines  Your health care provider may recommend certain vaccines, such as: Influenza vaccine. This is recommended every year. Tetanus, diphtheria, and acellular pertussis (Tdap, Td) vaccine. You may need a Td booster every 10 years. Zoster vaccine. You may need this after age 95. Pneumococcal 13-valent conjugate (PCV13) vaccine. One dose is recommended after age 60. Pneumococcal polysaccharide (PPSV23) vaccine. One dose is recommended after age 62. Talk to your health care provider about which screenings and  vaccines you need and how often you need them. This  information is not intended to replace advice given to you by your health care provider. Make sure you discuss any questions you have with your health care provider. Document Released: 02/20/2015 Document Revised: 10/14/2015 Document Reviewed: 11/25/2014 Elsevier Interactive Patient Education  2017 [place] Prevention in the Home Falls can cause injuries. They can happen to people of all ages. There are many things you can do to make your home safe and to help prevent falls. What can I do on the outside of my home? Regularly fix the edges of walkways and driveways and fix any cracks. Remove anything that might make you trip as you walk through a door, such as a raised step or threshold. Trim any bushes or trees on the path to your home. Use bright outdoor lighting. Clear any walking paths of anything that might make someone trip, such as rocks or tools. Regularly check to see if handrails are loose or broken. Make sure that both sides of any steps have handrails. Any raised decks and porches should have guardrails on the edges. Have any leaves, snow, or ice cleared regularly. Use sand or salt on walking paths during winter. Clean up any spills in your garage right away. This includes oil or grease spills. What can I do in the bathroom? Use night lights. Install grab bars by the toilet and in the tub and shower. Do not use towel bars as grab bars. Use non-skid mats or decals in the tub or shower. If you need to sit down in the shower, use a plastic, non-slip stool. Keep the floor dry. Clean up any water that spills on the floor as soon as it happens. Remove soap buildup in the tub or shower regularly. Attach bath mats securely with double-sided non-slip rug tape. Do not have throw rugs and other things on the floor that can make you trip. What can I do in the bedroom? Use night lights. Make sure that you have a light by your bed that is easy to reach. Do not use any sheets or  blankets that are too big for your bed. They should not hang down onto the floor. Have a firm chair that has side arms. You can use this for support while you get dressed. Do not have throw rugs and other things on the floor that can make you trip. What can I do in the kitchen? Clean up any spills right away. Avoid walking on wet floors. Keep items that you use a lot in easy-to-reach places. If you need to reach something above you, use a strong step stool that has a grab bar. Keep electrical cords out of the way. Do not use floor polish or wax that makes floors slippery. If you must use wax, use non-skid floor wax. Do not have throw rugs and other things on the floor that can make you trip. What can I do with my stairs? Do not leave any items on the stairs. Make sure that there are handrails on both sides of the stairs and use them. Fix handrails that are broken or loose. Make sure that handrails are as long as the stairways. Check any carpeting to make sure that it is firmly attached to the stairs. Fix any carpet that is loose or worn. Avoid having throw rugs at the top or bottom of the stairs. If you do have throw rugs, attach them to the floor with carpet tape. Make  sure that you have a light switch at the top of the stairs and the bottom of the stairs. If you do not have them, ask someone to add them for you. What else can I do to help prevent falls? Wear shoes that: Do not have high heels. Have rubber bottoms. Are comfortable and fit you well. Are closed at the toe. Do not wear sandals. If you use a stepladder: Make sure that it is fully opened. Do not climb a closed stepladder. Make sure that both sides of the stepladder are locked into place. Ask someone to hold it for you, if possible. Clearly mark and make sure that you can see: Any grab bars or handrails. First and last steps. Where the edge of each step is. Use tools that help you move around (mobility aids) if they are  needed. These include: Canes. Walkers. Scooters. Crutches. Turn on the lights when you go into a dark area. Replace any light bulbs as soon as they burn out. Set up your furniture so you have a clear path. Avoid moving your furniture around. If any of your floors are uneven, fix them. If there are any pets around you, be aware of where they are. Review your medicines with your doctor. Some medicines can make you feel dizzy. This can increase your chance of falling. Ask your doctor what other things that you can do to help prevent falls. This information is not intended to replace advice given to you by your health care provider. Make sure you discuss any questions you have with your health care provider. Document Released: 11/20/2008 Document Revised: 07/02/2015 Document Reviewed: 02/28/2014 Elsevier Interactive Patient Education  2017 Else[employer]

## 2021-03-09 ENCOUNTER — Other Ambulatory Visit: Payer: Self-pay | Admitting: Family Medicine

## 2021-03-09 NOTE — Telephone Encounter (Signed)
Spoke with pharmacy. Pt has refill available and states they will refill today. Request sent via Interface Requested Prescriptions  Pending Prescriptions Disp Refills   ezetimibe (ZETIA) 10 MG tablet [Pharmacy Med Name: EZETIMIBE 10MG  TABLETS] 90 tablet 1    Sig: TAKE 1 TABLET(10 MG) BY MOUTH DAILY     Cardiovascular:  Antilipid - Sterol Transport Inhibitors Failed - 03/09/2021  8:25 AM      Failed - Triglycerides in normal range and within 360 days    Triglycerides  Date Value Ref Range Status  12/10/2020 223 (H) 0 - 149 mg/dL Final         Passed - Total Cholesterol in normal range and within 360 days    Cholesterol, Total  Date Value Ref Range Status  12/10/2020 159 100 - 199 mg/dL Final         Passed - LDL in normal range and within 360 days    LDL Chol Calc (NIH)  Date Value Ref Range Status  12/10/2020 78 0 - 99 mg/dL Final         Passed - HDL in normal range and within 360 days    HDL  Date Value Ref Range Status  12/10/2020 44 >39 mg/dL Final         Passed - Valid encounter within last 12 months    Recent Outpatient Visits          1 month ago Acute non-recurrent maxillary sinusitis   [facility] [place], Lauren A, NP   2 months ago IFG (impaired fasting glucose)   [facility] [place], Megan P, DO   5 months ago Rib pain   [facility] [place], Lauren A, NP   8 months ago Recurrent major depressive disorder, in partial remission (HCC)   [facility] [place], Megan P, DO   9 months ago Mixed hyperlipidemia   [facility] [facility], [place], DO      Future Appointments            In 3 months Johnson, [place] y[place], DO Oralia Rud, [facility]

## 2021-03-25 ENCOUNTER — Other Ambulatory Visit: Payer: Self-pay | Admitting: Family Medicine

## 2021-03-26 NOTE — Telephone Encounter (Signed)
Requested Prescriptions  Pending Prescriptions Disp Refills   buPROPion (WELLBUTRIN XL) 300 MG 24 hr tablet [Pharmacy Med Name: BUPROPION XL 300MG  TABLETS] 90 tablet 0    Sig: TAKE 1 TABLET(300 MG) BY MOUTH DAILY     Psychiatry: Antidepressants - bupropion Failed - 03/25/2021  4:42 PM      Failed - Last BP in normal range    BP Readings from Last 1 Encounters:  01/12/21 (!) 144/88         Passed - Cr in normal range and within 360 days    Creat  Date Value Ref Range Status  01/19/2013 1.06 0.50 - 1.35 mg/dL Final   Creatinine, Ser  Date Value Ref Range Status  12/10/2020 1.02 0.76 - 1.27 mg/dL Final         Passed - AST in normal range and within 360 days    AST  Date Value Ref Range Status  12/10/2020 32 0 - 40 IU/L Final         Passed - ALT in normal range and within 360 days    ALT  Date Value Ref Range Status  12/10/2020 37 0 - 44 IU/L Final         Passed - Completed PHQ-2 or PHQ-9 in the last 360 days      Passed - Valid encounter within last 6 months    Recent Outpatient Visits          2 months ago Acute non-recurrent maxillary sinusitis   [facility] [place], Lauren A, NP   3 months ago IFG (impaired fasting glucose)   13/04/2020, Megan P, DO   5 months ago Rib pain   [facility] [place], Lauren A, NP   9 months ago Recurrent major depressive disorder, in partial remission (HCC)   [facility] [place], Megan P, DO   10 months ago Mixed hyperlipidemia   [facility], [place], DO      Future Appointments            In 2 months Johnson, [place] yan, DO Oralia Rud, [facility]

## 2021-03-30 ENCOUNTER — Encounter: Payer: Self-pay | Admitting: Family Medicine

## 2021-06-09 ENCOUNTER — Ambulatory Visit (INDEPENDENT_AMBULATORY_CARE_PROVIDER_SITE_OTHER): Payer: Medicare HMO | Admitting: Family Medicine

## 2021-06-09 ENCOUNTER — Encounter: Payer: Self-pay | Admitting: Family Medicine

## 2021-06-09 VITALS — BP 134/80 | HR 53 | Temp 97.5°F | Wt 188.6 lb

## 2021-06-09 DIAGNOSIS — F3341 Major depressive disorder, recurrent, in partial remission: Secondary | ICD-10-CM

## 2021-06-09 DIAGNOSIS — R7301 Impaired fasting glucose: Secondary | ICD-10-CM | POA: Diagnosis not present

## 2021-06-09 DIAGNOSIS — E782 Mixed hyperlipidemia: Secondary | ICD-10-CM

## 2021-06-09 DIAGNOSIS — Z0000 Encounter for general adult medical examination without abnormal findings: Secondary | ICD-10-CM | POA: Diagnosis not present

## 2021-06-09 DIAGNOSIS — Z23 Encounter for immunization: Secondary | ICD-10-CM | POA: Diagnosis not present

## 2021-06-09 DIAGNOSIS — R69 Illness, unspecified: Secondary | ICD-10-CM | POA: Diagnosis not present

## 2021-06-09 DIAGNOSIS — R3911 Hesitancy of micturition: Secondary | ICD-10-CM | POA: Diagnosis not present

## 2021-06-09 DIAGNOSIS — M25512 Pain in left shoulder: Secondary | ICD-10-CM | POA: Diagnosis not present

## 2021-06-09 LAB — URINALYSIS, ROUTINE W REFLEX MICROSCOPIC
Bilirubin, UA: NEGATIVE
Glucose, UA: NEGATIVE
Ketones, UA: NEGATIVE
Leukocytes,UA: NEGATIVE
Nitrite, UA: NEGATIVE
Protein,UA: NEGATIVE
RBC, UA: NEGATIVE
Specific Gravity, UA: 1.015 (ref 1.005–1.030)
Urobilinogen, Ur: 0.2 mg/dL (ref 0.2–1.0)
pH, UA: 6 (ref 5.0–7.5)

## 2021-06-09 LAB — MICROALBUMIN, URINE WAIVED
Creatinine, Urine Waived: 50 mg/dL (ref 10–300)
Microalb, Ur Waived: 10 mg/L (ref 0–19)
Microalb/Creat Ratio: <30 mg/g (ref <30)

## 2021-06-09 LAB — BAYER DCA HB A1C WAIVED: HB A1C (BAYER DCA - WAIVED): 5.8 % — ABNORMAL HIGH (ref 4.8–5.6)

## 2021-06-09 MED ORDER — EZETIMIBE 10 MG PO TABS: 10.0000 mg | ORAL_TABLET | Freq: Every day | ORAL | 1 refills | Status: DC

## 2021-06-09 MED ORDER — BUPROPION HCL ER (XL) 300 MG PO TB24: ORAL_TABLET | ORAL | 1 refills | Status: DC

## 2021-06-09 MED ORDER — TRAZODONE HCL 50 MG PO TABS: 25.0000 mg | ORAL_TABLET | Freq: Every evening | ORAL | 3 refills | Status: DC | PRN

## 2021-06-09 MED ORDER — SIMVASTATIN 40 MG PO TABS: 40.0000 mg | ORAL_TABLET | Freq: Every day | ORAL | 1 refills | Status: DC

## 2021-06-09 NOTE — Progress Notes (Signed)
? ?BP 134/80   Pulse (!) 53   Temp (!) 97.5 ?F (36.4 ?C)   Wt 188 lb 9.6 oz (85.5 kg)   SpO2 97%   BMI 27.65 kg/m?   ? ?Subjective:  ? ? Patient ID: Wayne Robles, male    DOB: 31-May-1945, 76 y.o.   MRN: [id number] ? ?HPI: ?Wayne Robles is a 76 y.o. male presenting on 06/09/2021 for comprehensive medical examination. Current medical complaints include: ? ?HYPERLIPIDEMIA ?Hyperlipidemia status: excellent compliance ?Satisfied with current treatment?  yes ?Side effects:  none ?Medication compliance: excellent compliance ?Past cholesterol meds: zetia, simvastatin ?Supplements: none ?Aspirin:  no ?The 10-year ASCVD risk score (Arnett DK, et al., 2019) is: 22.6% ?  Values used to calculate the score: ?    Age: 77 years ?    Sex: Male ?    Is Non-Hispanic African American: No ?    Diabetic: No ?    Tobacco smoker: No ?    Systolic Blood Pressure: 134 mmHg ?    Is BP treated: No ?    HDL Cholesterol: 44 mg/dL ?    Total Cholesterol: 159 mg/dL ?Chest pain:  no ?Coronary artery disease:  no ? ?SHOULDER PAIN ?Duration: few weeks ?Involved shoulder: left ?Mechanism of injury:  yard work ?Location: diffuse ?Onset:sudden ?Severity: moderate  ?Quality:  shooting and tingling ?Frequency: intermittent ?Radiation: yes- into his neck ?Aggravating factors: lifting and movement  ?Alleviating factors: nothing  ?Status: fluctuating ?Treatments attempted: none  ?Relief with NSAIDs?:  No NSAIDs Taken ?Weakness: no ?Numbness: yes ?Decreased grip strength: no ?Redness: no ?Swelling: no ?Bruising: no ?Fevers: no ? ? ?DEPRESSION ?Mood status: controlled ?Satisfied with current treatment?: no ?Symptom severity: mild  ?Duration of current treatment : chronic ?Side effects: yes ?Medication compliance: excellent compliance ?Psychotherapy/counseling: no  ?Previous psychiatric medications: wellbutrin ?Depressed mood: yes ?Anxious mood: yes ?Anhedonia: no ?Significant weight loss or gain: no ?Insomnia: no  ?Fatigue: yes ?Feelings of  worthlessness or guilt: no ?Impaired concentration/indecisiveness: no ?Suicidal ideations: no ?Hopelessness: no ?Crying spells: no ? ?  06/09/2021  ?  8:48 AM 02/17/2021  ?  9:06 AM 01/12/2021  ? 11:15 AM 12/10/2020  ?  9:22 AM 06/26/2020  ?  8:48 AM  ?Depression screen PHQ 2/9  ?Decreased Interest 0 1 1 0 0  ?Down, Depressed, Hopeless 1 0 1 0 0  ?PHQ - 2 Score 1 1 2  0 0  ?Altered sleeping 1 1 1  0 1  ?Tired, decreased energy 1 1 1  0 0  ?Change in appetite 1 1 1  0 0  ?Feeling bad or failure about yourself  1 1 1 1 1   ?Trouble concentrating 1 1 1  0 1  ?Moving slowly or fidgety/restless 0 0 0 0 0  ?Suicidal thoughts 1 0 0 0 0  ?PHQ-9 Score 7 6 7 1 3   ?Difficult doing work/chores  Somewhat difficult Somewhat difficult  Not difficult at all  ? ?Impaired Fasting Glucose ?HbA1C:  ?Lab Results  ?Component Value Date  ? HGBA1C 5.8 (H) 06/09/2021  ? ?Duration of elevated blood sugar: chronic ?Polydipsia: no ?Polyuria: no ?Weight change: no ?Visual disturbance: no ?Glucose Monitoring: no ?Diabetic Education: Not Completed ?Family history of diabetes: yes ? ? ?He currently lives with: wife ?Interim Problems from his last visit: no ? ?Depression Screen done today and results listed below:  ? ?  06/09/2021  ?  8:48 AM 02/17/2021  ?  9:06 AM 01/12/2021  ? 11:15 AM 12/10/2020  ?  9:22 AM  06/26/2020  ?  8:48 AM  ?Depression screen PHQ 2/9  ?Decreased Interest 0 1 1 0 0  ?Down, Depressed, Hopeless 1 0 1 0 0  ?PHQ - 2 Score 1 1 2  0 0  ?Altered sleeping 1 1 1  0 1  ?Tired, decreased energy 1 1 1  0 0  ?Change in appetite 1 1 1  0 0  ?Feeling bad or failure about yourself  1 1 1 1 1   ?Trouble concentrating 1 1 1  0 1  ?Moving slowly or fidgety/restless 0 0 0 0 0  ?Suicidal thoughts 1 0 0 0 0  ?PHQ-9 Score 7 6 7 1 3   ?Difficult doing work/chores  Somewhat difficult Somewhat difficult  Not difficult at all  ? ? ?Past Medical History:  ?Past Medical History:  ?Diagnosis Date  ? Depression   ? History of colonic polyps   ? Hyperlipidemia   ?  Hyperthyroidism   ? Vitamin D deficiency   ? ? ?Surgical History:  ?Past Surgical History:  ?Procedure Laterality Date  ? APPENDECTOMY    ? tonsillectomy    ? ? ?Medications:  ?Current Outpatient Medications on File Prior to Visit  ?Medication Sig  ? meloxicam (MOBIC) 15 MG tablet Take 15 mg by mouth daily.  ? Multiple Vitamin (MULTIVITAMIN) tablet Take 1 tablet by mouth daily.  ? ?No current facility-administered medications on file prior to visit.  ? ? ?Allergies:  ?No Known Allergies ? ?Social History:  ?Social History  ? ?Socioeconomic History  ? Marital status: Married  ?  Spouse name: Not on file  ? Number of children: Not on file  ? Years of education: Not on file  ? Highest education level: Not on file  ?Occupational History  ? Not on file  ?Tobacco Use  ? Smoking status: Never  ? Smokeless tobacco: Never  ?Vaping Use  ? Vaping Use: Never used  ?Substance and Sexual Activity  ? Alcohol use: No  ? Drug use: No  ? Sexual activity: Yes  ?Other Topics Concern  ? Not on file  ?Social History Narrative  ? Not on file  ? ?Social Determinants of Health  ? ?Financial Resource Strain: Low Risk   ? Difficulty of Paying Living Expenses: Not hard at all  ?Food Insecurity: No Food Insecurity  ? Worried About Programme researcher, broadcasting/film/videounning Out of Food in the Last Year: Never true  ? Ran Out of Food in the Last Year: Never true  ?Transportation Needs: No Transportation Needs  ? Lack of Transportation (Medical): No  ? Lack of Transportation (Non-Medical): No  ?Physical Activity: Inactive  ? Days of Exercise per Week: 0 days  ? Minutes of Exercise per Session: 0 min  ?Stress: No Stress Concern Present  ? Feeling of Stress : Not at all  ?Social Connections: Moderately Integrated  ? Frequency of Communication with Friends and Family: Three times a week  ? Frequency of Social Gatherings with Friends and Family: Twice a week  ? Attends Religious Services: Never  ? Active Member of Clubs or Organizations: No  ? Attends BankerClub or Organization Meetings: 1  to 4 times per year  ? Marital Status: Married  ?Intimate Partner Violence: Not At Risk  ? Fear of Current or Ex-Partner: No  ? Emotionally Abused: No  ? Physically Abused: No  ? Sexually Abused: No  ? ?Social History  ? ?Tobacco Use  ?Smoking Status Never  ?Smokeless Tobacco Never  ? ?Social History  ? ?Substance and Sexual Activity  ?Alcohol Use  No  ? ? ?Family History:  ?Family History  ?Problem Relation Age of Onset  ? Cancer Mother   ?     Ovarian  ? Diabetes Father   ? Cancer Father   ?     Prostate  ? Anxiety disorder Daughter   ? ? ?Past medical history, surgical history, medications, allergies, family history and social history reviewed with patient today and changes made to appropriate areas of the chart.  ? ?Review of Systems  ?Constitutional: Negative.   ?HENT: Negative.    ?Eyes: Negative.   ?Respiratory: Negative.    ?Cardiovascular: Negative.   ?Gastrointestinal: Negative.   ?Genitourinary: Negative.   ?Musculoskeletal:  Positive for joint pain and neck pain. Negative for back pain, falls and myalgias.  ?Skin: Negative.   ?Neurological:  Positive for dizziness (with positional changes occasionally). Negative for tingling, tremors, sensory change, speech change, focal weakness, seizures, loss of consciousness, weakness and headaches.  ?Endo/Heme/Allergies:  Positive for environmental allergies. Negative for polydipsia. Does not bruise/bleed easily.  ?Psychiatric/Behavioral:  Negative for depression, hallucinations, memory loss, substance abuse and suicidal ideas. The patient has insomnia. The patient is not nervous/anxious.   ?All other ROS negative except what is listed above and in the HPI.  ? ?   ?Objective:  ?  ?BP 134/80   Pulse (!) 53   Temp (!) 97.5 ?F (36.4 ?C)   Wt 188 lb 9.6 oz (85.5 kg)   SpO2 97%   BMI 27.65 kg/m?   ?Wt Readings from Last 3 Encounters:  ?06/09/21 188 lb 9.6 oz (85.5 kg)  ?01/12/21 186 lb (84.4 kg)  ?12/10/20 187 lb 9.6 oz (85.1 kg)  ?  ?Physical Exam ?Vitals and  nursing note reviewed.  ?Constitutional:   ?   General: He is not in acute distress. ?   Appearance: Normal appearance. He is normal weight. He is not ill-appearing, toxic-appearing or diaphoretic.  ?HENT:  ?   Head: Normocephalic and atrau

## 2021-06-09 NOTE — Assessment & Plan Note (Signed)
Doing well with A1c of 5.8. Continue diet and exercise. Call with any concerns.  

## 2021-06-09 NOTE — Assessment & Plan Note (Signed)
Under good control on current regimen. Continue current regimen. Continue to monitor. Call with any concerns. Refills given. Labs drawn today.   

## 2021-06-10 LAB — COMPREHENSIVE METABOLIC PANEL
ALT: 28 IU/L (ref 0–44)
AST: 33 IU/L (ref 0–40)
Albumin/Globulin Ratio: 1.8 (ref 1.2–2.2)
Albumin: 4.8 g/dL — ABNORMAL HIGH (ref 3.7–4.7)
Alkaline Phosphatase: 137 IU/L — ABNORMAL HIGH (ref 44–121)
BUN/Creatinine Ratio: 20 (ref 10–24)
BUN: 20 mg/dL (ref 8–27)
Bilirubin Total: 1.1 mg/dL (ref 0.0–1.2)
CO2: 23 mmol/L (ref 20–29)
Calcium: 9.5 mg/dL (ref 8.6–10.2)
Chloride: 100 mmol/L (ref 96–106)
Creatinine, Ser: 0.99 mg/dL (ref 0.76–1.27)
Globulin, Total: 2.6 g/dL (ref 1.5–4.5)
Glucose: 94 mg/dL (ref 70–99)
Potassium: 4.6 mmol/L (ref 3.5–5.2)
Sodium: 139 mmol/L (ref 134–144)
Total Protein: 7.4 g/dL (ref 6.0–8.5)
eGFR: 80 mL/min/{1.73_m2} (ref >59)

## 2021-06-10 LAB — CBC WITH DIFFERENTIAL/PLATELET
Basophils Absolute: 0 10*3/uL (ref 0.0–0.2)
Basos: 0 %
EOS (ABSOLUTE): 0.1 10*3/uL (ref 0.0–0.4)
Eos: 1 %
Hematocrit: 51 % (ref 37.5–51.0)
Hemoglobin: 16.8 g/dL (ref 13.0–17.7)
Immature Grans (Abs): 0.1 10*3/uL (ref 0.0–0.1)
Immature Granulocytes: 1 %
Lymphocytes Absolute: 1.4 10*3/uL (ref 0.7–3.1)
Lymphs: 28 %
MCH: 31 pg (ref 26.6–33.0)
MCHC: 32.9 g/dL (ref 31.5–35.7)
MCV: 94 fL (ref 79–97)
Monocytes Absolute: 0.4 10*3/uL (ref 0.1–0.9)
Monocytes: 9 %
Neutrophils Absolute: 3 10*3/uL (ref 1.4–7.0)
Neutrophils: 61 %
Platelets: 219 10*3/uL (ref 150–450)
RBC: 5.42 x10E6/uL (ref 4.14–5.80)
RDW: 12.5 % (ref 11.6–15.4)
WBC: 5 10*3/uL (ref 3.4–10.8)

## 2021-06-10 LAB — LIPID PANEL W/O CHOL/HDL RATIO
Cholesterol, Total: 180 mg/dL (ref 100–199)
HDL: 48 mg/dL (ref >39)
LDL Chol Calc (NIH): 106 mg/dL — ABNORMAL HIGH (ref 0–99)
Triglycerides: 146 mg/dL (ref 0–149)
VLDL Cholesterol Cal: 26 mg/dL (ref 5–40)

## 2021-06-10 LAB — PSA: Prostate Specific Ag, Serum: 2.2 ng/mL (ref 0.0–4.0)

## 2021-06-10 LAB — TSH: TSH: 2.89 u[IU]/mL (ref 0.450–4.500)

## 2021-06-11 ENCOUNTER — Ambulatory Visit (HOSPITAL_BASED_OUTPATIENT_CLINIC_OR_DEPARTMENT_OTHER)
Admission: RE | Admit: 2021-06-11 | Discharge: 2021-06-11 | Disposition: A | Discharge status: Home / Self Care | Payer: Medicare HMO | Source: Ambulatory Visit | Attending: Family Medicine | Admitting: Family Medicine

## 2021-06-11 DIAGNOSIS — M542 Cervicalgia: Secondary | ICD-10-CM | POA: Diagnosis not present

## 2021-06-11 DIAGNOSIS — M25512 Pain in left shoulder: Secondary | ICD-10-CM | POA: Diagnosis not present

## 2021-06-11 DIAGNOSIS — M19012 Primary osteoarthritis, left shoulder: Secondary | ICD-10-CM | POA: Diagnosis not present

## 2021-06-15 ENCOUNTER — Other Ambulatory Visit: Payer: Self-pay | Admitting: Family Medicine

## 2021-06-15 ENCOUNTER — Encounter: Payer: Self-pay | Admitting: Family Medicine

## 2021-06-15 DIAGNOSIS — M5412 Radiculopathy, cervical region: Secondary | ICD-10-CM

## 2021-06-17 NOTE — Progress Notes (Signed)
? ? Aleen Sells D.Judd Gaudier ?[facility] ?[address] [place] [address] ?Phone: [phone] ?  ?Assessment and Plan:   ?  ?1. DDD (degenerative disc disease), cervical ?2. Chronic left shoulder pain ?-Chronic with exacerbation, initial sports medicine visit ?- Likely DDD of C-spine, most prominent in C5-6, causing intermittent flares of pain and leading to compensation with left shoulder ?- Patient's most acute flare has recently improved, so we will continue with using meloxicam 15 mg as needed for day-to-day pain relief ?- If pain was to recur, recommend treating pain flare with  meloxicam 15 mg daily x2 weeks.  If still having pain after 2 weeks, complete 3rd-week of meloxicam. May use remaining meloxicam as needed once daily for pain control.  Do not to use additional NSAIDs while taking meloxicam.  May use Tylenol [phone] mg 2 to 3 times a day for breakthrough pain. ?- Reviewed x-ray with patient and his wife in clinic.  My interpretation: No acute fracture, dislocation, or vertebral collapse.  Cortical changes prominent through C3-7 with most significant changes at C5-C6.  Mild glenoid bone spurring. ?- Start HEP for shoulder and neck ? ? ?  ?Pertinent previous records reviewed include shoulder x-ray 06/11/2021, C-spine x-ray 06/11/2021, PCP note 06/09/2021 ?  ?Follow Up: 4 to 6 weeks for reevaluation to see if conservative therapy is adequate for patient.  Could consider advanced imaging for epidural if no improvement or worsening of symptoms ?  ?Subjective:   ?I, Jerene Canny, am serving as a Neurosurgeon for [employer] ? ?Chief Complaint: cervical radiculopathy  ? ?HPI:  ?06/18/2021 ?Patient is a 76 year old male complaining of cervical radiculopathy. Patient states that he has arthritis in the neck today isnt most days its bad where he has a crawling feeling that goes up to his face and through his shoulder yesterday he was barley able to move , feels like a tugging on  his shoulder kind of constant , started 3-4 months ago, takes ib or tylenol and meloxicam  ? ?Relevant Historical Information: None pertinent ? ?Additional pertinent review of systems negative. ? ? ?Current Outpatient Medications:  ?  buPROPion (WELLBUTRIN XL) 300 MG 24 hr tablet, TAKE 1 TABLET(300 MG) BY MOUTH DAILY, Disp: 90 tablet, Rfl: 1 ?  ezetimibe (ZETIA) 10 MG tablet, Take 1 tablet (10 mg total) by mouth daily., Disp: 90 tablet, Rfl: 1 ?  meloxicam (MOBIC) 15 MG tablet, Take 1 tablet (15 mg total) by mouth daily., Disp: 30 tablet, Rfl: 0 ?  Multiple Vitamin (MULTIVITAMIN) tablet, Take 1 tablet by mouth daily., Disp: , Rfl:  ?  simvastatin (ZOCOR) 40 MG tablet, Take 1 tablet (40 mg total) by mouth daily., Disp: 90 tablet, Rfl: 1 ?  traZODone (DESYREL) 50 MG tablet, Take 0.5-1 tablets (25-50 mg total) by mouth at bedtime as needed for sleep., Disp: 30 tablet, Rfl: 3  ? ?Objective:   ?  ?Vitals:  ? 06/18/21 1054  ?BP: 122/78  ?Pulse: 67  ?SpO2: 99%  ?Weight: 186 lb (84.4 kg)  ?Height: 5\' 9"  (1.753 m)  ?  ?  ?Body mass index is 27.47 kg/m?.  ?  ?Physical Exam:   ? ?Cervical Spine: Posture normal ?Skin: normal, intact ? ?Neurological:  ? ?Strength: ? Right  Left   ?Deltoid 5/5 5/5  ?Bicep 5/5  5/5  ?Tricep 5/5 5/5  ?Wrist Flexion 5/5 5/5  ?Wrist Extension 5/5 5/5  ?Grip 5/5 5/5  ?Finger Abduction 5/5 5/5  ? ?Sensation:  intact to light touch in upper extremities bilaterally ? ?Spurling's:  negative bilaterally ?Neck ROM: Decreased flexion, left-sided rotation and sidebending ?TTP: Cervical paraspinal ?NTTP: cervical spinous processes, thoracic paraspinal, trapezius  ? ?Gen: Appears well, nad, nontoxic and pleasant ?Neuro:sensation intact, strength is 5/5 with df/pf/inv/ev, muscle tone wnl ?Skin: no suspicious lesion or defmority ?Psych: A&O, appropriate mood and affect ? ?Left shoulder: no deformity, swelling or muscle wasting ?No scapular winging ?FF 180, abd 180, int 10, ext 90 ?TTP mildly anterior shoulder  musculature ?NTTP over the [place], clavicle, ac, coracoid, biceps groove, humerus, deltoid, trapezius, cervical spine ?Positive Hawking's, empty can, O'Brien ?Neg neer,   subscap liftoff, speeds,  , crossarm ?Neg ant drawer, sulcus sign, apprehension ?  ? ?Electronically signed by:  ?Aleen Sells D.Judd Gaudier ?[facility] ?11:33 AM 06/18/21 ?

## 2021-06-18 ENCOUNTER — Ambulatory Visit: Payer: Medicare HMO | Admitting: Sports Medicine

## 2021-06-18 VITALS — BP 122/78 | HR 67 | Ht 69.0 in | Wt 186.0 lb

## 2021-06-18 DIAGNOSIS — M5030 Other cervical disc degeneration, unspecified cervical region: Secondary | ICD-10-CM | POA: Diagnosis not present

## 2021-06-18 DIAGNOSIS — M25512 Pain in left shoulder: Secondary | ICD-10-CM

## 2021-06-18 DIAGNOSIS — G8929 Other chronic pain: Secondary | ICD-10-CM

## 2021-06-18 MED ORDER — MELOXICAM 15 MG PO TABS: 15.0000 mg | ORAL_TABLET | Freq: Every day | ORAL | 0 refills | Status: DC

## 2021-06-18 NOTE — Patient Instructions (Addendum)
Good to see  ?Refill of meloxicam 30 0 ?Recommend using as needed for pain relief limit to no more than 2-3 in a week ?If you have a flare of pain then recommend - Start meloxicam 15 mg daily x2 weeks.  If still having pain after 2 weeks, complete 3rd-week of meloxicam. May use remaining meloxicam as needed once daily for pain control.  Do not to use additional NSAIDs while taking meloxicam.  May use Tylenol [phone] mg 2 to 3 times a day for breakthrough pain. ?Neck and shoulder HEP  ?4-6 week follow up  ? ?

## 2021-07-05 ENCOUNTER — Other Ambulatory Visit: Payer: Self-pay | Admitting: Family Medicine

## 2021-07-06 ENCOUNTER — Other Ambulatory Visit: Payer: Self-pay | Admitting: Family Medicine

## 2021-07-07 NOTE — Telephone Encounter (Signed)
Requested medication (s) are due for refill today:   Yes  Requested medication (s) are on the active medication list:   Yes  Future visit scheduled:   Yes   Last ordered: 06/09/2021 #30, 3 refills  Returned because pharmacy requesting a 90 day supply   Requested Prescriptions  Pending Prescriptions Disp Refills   traZODone (DESYREL) 50 MG tablet [Pharmacy Med Name: TRAZODONE 50 MG TABLET] 90 tablet 2    Sig: TAKE 1/2 TO 1 TABLET AT BEDTIME AS NEEDED FOR SLEEP     Psychiatry: Antidepressants - Serotonin Modulator Passed - 07/05/2021 12:31 PM      Passed - Completed PHQ-2 or PHQ-9 in the last 360 days      Passed - Valid encounter within last 6 months    Recent Outpatient Visits           4 weeks ago Routine general medical examination at a health care facility   [facility], Megan P, DO   5 months ago Acute non-recurrent maxillary sinusitis   [facility] [place], Lauren A, NP   6 months ago IFG (impaired fasting glucose)   [facility] [place], Megan P, DO   9 months ago Rib pain   [facility] [place], Lauren A, NP   1 year ago Recurrent major depressive disorder, in partial remission (HCC)   [facility] Dorcas Carrow, DO       Future Appointments             In 2 weeks Richardean Sale, DO [facility]   In 5 months Laural Benes, Oralia Rud, DO [employer] [facility], [facility]

## 2021-07-07 NOTE — Telephone Encounter (Signed)
Requested medication (s) are due for refill today: no  Requested medication (s) are on the active medication list: yes  Last refill:  06/09/21  Future visit scheduled: yes  Notes to clinic:  Unable to refill per protocol, rx request is too soon.     Requested Prescriptions  Pending Prescriptions Disp Refills   buPROPion (WELLBUTRIN XL) 300 MG 24 hr tablet [Pharmacy Med Name: BUPROPION XL 300MG  TABLETS] 90 tablet 1    Sig: TAKE 1 TABLET(300 MG) BY MOUTH DAILY     Psychiatry: Antidepressants - bupropion Passed - 07/06/2021  8:54 AM      Passed - Cr in normal range and within 360 days    Creat  Date Value Ref Range Status  01/19/2013 1.06 0.50 - 1.35 mg/dL Final   Creatinine, Ser  Date Value Ref Range Status  06/09/2021 0.99 0.76 - 1.27 mg/dL Final         Passed - AST in normal range and within 360 days    AST  Date Value Ref Range Status  06/09/2021 33 0 - 40 IU/L Final         Passed - ALT in normal range and within 360 days    ALT  Date Value Ref Range Status  06/09/2021 28 0 - 44 IU/L Final         Passed - Completed PHQ-2 or PHQ-9 in the last 360 days      Passed - Last BP in normal range    BP Readings from Last 1 Encounters:  06/18/21 122/78         Passed - Valid encounter within last 6 months    Recent Outpatient Visits           4 weeks ago Routine general medical examination at a health care facility   [facility], Megan P, DO   5 months ago Acute non-recurrent maxillary sinusitis   [facility] [place], Lauren A, NP   6 months ago IFG (impaired fasting glucose)   [facility] [place], Megan P, DO   9 months ago Rib pain   [facility] [place], Lauren A, NP   1 year ago Recurrent major depressive disorder, in partial remission (HCC)   [facility] [place], DO       Future Appointments             In 2 weeks Dorcas Carrow, DO [facility]   In 5 months  Richardean Sale, Laural Benes, DO [facility], [facility]

## 2021-07-20 ENCOUNTER — Other Ambulatory Visit: Payer: Self-pay | Admitting: Sports Medicine

## 2021-07-23 ENCOUNTER — Ambulatory Visit: Payer: Medicare HMO | Admitting: Sports Medicine

## 2021-12-02 ENCOUNTER — Other Ambulatory Visit: Payer: Self-pay | Admitting: Family Medicine

## 2021-12-02 NOTE — Telephone Encounter (Signed)
Requested Prescriptions  Pending Prescriptions Disp Refills  . simvastatin (ZOCOR) 40 MG tablet [Pharmacy Med Name: SIMVASTATIN 40 MG TABLET] 90 tablet 2    Sig: TAKE 1 TABLET BY MOUTH EVERY DAY     Cardiovascular:  Antilipid - Statins Failed - 12/02/2021  1:21 AM      Failed - Lipid Panel in normal range within the last 12 months    Cholesterol, Total  Date Value Ref Range Status  06/09/2021 180 100 - 199 mg/dL Final   LDL Chol Calc (NIH)  Date Value Ref Range Status  06/09/2021 106 (H) 0 - 99 mg/dL Final   HDL  Date Value Ref Range Status  06/09/2021 48 >39 mg/dL Final   Triglycerides  Date Value Ref Range Status  06/09/2021 146 0 - 149 mg/dL Final         Passed - Patient is not pregnant      Passed - Valid encounter within last 12 months    Recent Outpatient Visits          5 months ago Routine general medical examination at a health care facility   [facility], Megan P, DO   10 months ago Acute non-recurrent maxillary sinusitis   [facility] [place], Lauren A, NP   11 months ago IFG (impaired fasting glucose)   [facility] [place], Megan P, DO   1 year ago Rib pain   [facility] [place], Lauren A, NP   1 year ago Recurrent major depressive disorder, in partial remission ([place])   [place] [place], DO      Future Appointments            In 1 week Johnson, Barb Merino, DO [employer], [facility]

## 2021-12-10 ENCOUNTER — Ambulatory Visit (INDEPENDENT_AMBULATORY_CARE_PROVIDER_SITE_OTHER): Payer: Medicare HMO | Admitting: Family Medicine

## 2021-12-10 ENCOUNTER — Encounter: Payer: Self-pay | Admitting: Family Medicine

## 2021-12-10 VITALS — BP 133/71 | HR 56 | Temp 98.0°F | Wt 185.5 lb

## 2021-12-10 DIAGNOSIS — E782 Mixed hyperlipidemia: Secondary | ICD-10-CM

## 2021-12-10 DIAGNOSIS — F3341 Major depressive disorder, recurrent, in partial remission: Secondary | ICD-10-CM

## 2021-12-10 DIAGNOSIS — Z1159 Encounter for screening for other viral diseases: Secondary | ICD-10-CM | POA: Diagnosis not present

## 2021-12-10 DIAGNOSIS — R7301 Impaired fasting glucose: Secondary | ICD-10-CM

## 2021-12-10 DIAGNOSIS — R69 Illness, unspecified: Secondary | ICD-10-CM | POA: Diagnosis not present

## 2021-12-10 LAB — BAYER DCA HB A1C WAIVED: HB A1C (BAYER DCA - WAIVED): 6.3 % — ABNORMAL HIGH (ref 4.8–5.6)

## 2021-12-10 NOTE — Assessment & Plan Note (Signed)
Under good control on current regimen. Continue current regimen. Continue to monitor. Call with any concerns[facility] Labs drawn today.   

## 2021-12-10 NOTE — Assessment & Plan Note (Signed)
Rechecking labs today. Await results. Treat as needed.  °

## 2021-12-10 NOTE — Progress Notes (Signed)
BP 133/71   Pulse (!) 56   Temp 98 F (36.7 C)   Wt 185 lb 8 oz (84.1 kg)   SpO2 99%   BMI 27.39 kg/m    Subjective:    Patient ID: Wayne Robles, male    DOB: [birth year], 76 y.o.   MRN: [id number]  HPI: Wayne Robles is a 76 y.o. male  Chief Complaint  Patient presents with   Depression   Hyperlipidemia   IFG   HYPERLIPIDEMIA Hyperlipidemia status: excellent compliance Satisfied with current treatment?  yes Side effects:  no Medication compliance: excellent compliance Past cholesterol meds: simvastatin, zetia Supplements: none Aspirin:  no The 10-year ASCVD risk score (Arnett DK, et al., 2019) is: 22.7%   Values used to calculate the score:     Age: 60 years     Sex: Male     Is Non-Hispanic African American: No     Diabetic: No     Tobacco smoker: No     Systolic Blood Pressure: 945 mmHg     Is BP treated: No     HDL Cholesterol: 48 mg/dL     Total Cholesterol: 180 mg/dL Chest pain:  no Coronary artery disease:  no  DEPRESSION Mood status: controlled Satisfied with current treatment?: yes Symptom severity: mild  Duration of current treatment : chronic Side effects: no Medication compliance: excellent compliance Psychotherapy/counseling: no  Previous psychiatric medications: wellbutrin Depressed mood: no Anxious mood: no Anhedonia: no Significant weight loss or gain: no Insomnia: no  Fatigue: no Feelings of worthlessness or guilt: no Impaired concentration/indecisiveness: no Suicidal ideations: no Hopelessness: no Crying spells: no    12/10/2021    8:05 AM 06/09/2021    8:48 AM 02/17/2021    9:06 AM 01/12/2021   11:15 AM 12/10/2020    9:22 AM  Depression screen PHQ 2/9  Decreased Interest 0 0 1 1 0  Down, Depressed, Hopeless 0 1 0 1 0  PHQ - 2 Score 0 _0 0  Altered sleeping 0 _1 0  Tired, decreased energy 0 _2 0  Change in appetite 0 _3 0  Feeling bad or failure about yourself  0 _4 Trouble concentrating 0 _5 0   Moving slowly or fidgety/restless 0 0 0 0 0  Suicidal thoughts 0 1 0 0 0  PHQ-9 Score 0 _6 Difficult doing work/chores   Somewhat difficult Somewhat difficult      Relevant past medical, surgical, family and social history reviewed and updated as indicated. Interim medical history since our last visit reviewed. Allergies and medications reviewed and updated.  Review of Systems  Constitutional: Negative.   Respiratory: Negative.    Cardiovascular: Negative.   Gastrointestinal: Negative.   Musculoskeletal: Negative.   Psychiatric/Behavioral: Negative.      Per HPI unless specifically indicated above     Objective:    BP 133/71   Pulse (!) 56   Temp 98 F (36.7 C)   Wt 185 lb 8 oz (84.1 kg)   SpO2 99%   BMI 27.39 kg/m   Wt Readings from Last 3 Encounters:  12/10/21 185 lb 8 oz (84.1 kg)  06/18/21 186 lb (84.4 kg)  06/09/21 188 lb 9.6 oz (85.5 kg)    Physical Exam Vitals and nursing note reviewed.  Constitutional:      General: He is not in acute distress.    Appearance: Normal appearance.  He is not ill-appearing, toxic-appearing or diaphoretic.  HENT:     Head: Normocephalic and atraumatic.     Right Ear: External ear normal.     Left Ear: External ear normal.     Nose: Nose normal.     Mouth/Throat:     Mouth: Mucous membranes are moist.     Pharynx: Oropharynx is clear.  Eyes:     General: No scleral icterus.       Right eye: No discharge.        Left eye: No discharge.     Extraocular Movements: Extraocular movements intact.     Conjunctiva/sclera: Conjunctivae normal.     Pupils: Pupils are equal, round, and reactive to light.  Cardiovascular:     Rate and Rhythm: Normal rate and regular rhythm.     Pulses: Normal pulses.     Heart sounds: Normal heart sounds. No murmur heard.    No friction rub. No gallop.  Pulmonary:     Effort: Pulmonary effort is normal. No respiratory distress.     Breath sounds: Normal breath sounds. No stridor. No  wheezing, rhonchi or rales.  Chest:     Chest wall: No tenderness.  Musculoskeletal:        General: Normal range of motion.     Cervical back: Normal range of motion and neck supple.  Skin:    General: Skin is warm and dry.     Capillary Refill: Capillary refill takes less than 2 seconds.     Coloration: Skin is not jaundiced or pale.     Findings: No bruising, erythema, lesion or rash.  Neurological:     General: No focal deficit present.     Mental Status: He is alert and oriented to person, place, and time. Mental status is at baseline.  Psychiatric:        Mood and Affect: Mood normal.        Behavior: Behavior normal.        Thought Content: Thought content normal.        Judgment: Judgment normal.     Results for orders placed or performed in visit on 06/09/21  Comprehensive metabolic panel  Result Value Ref Range   Glucose 94 70 - 99 mg/dL   BUN 20 8 - 27 mg/dL   Creatinine, Ser 0.99 0.76 - 1.27 mg/dL   eGFR 80 >59 mL/min/1.73   BUN/Creatinine Ratio 20 10 - 24   Sodium 139 134 - 144 mmol/L   Potassium 4.6 3.5 - 5.2 mmol/L   Chloride 100 96 - 106 mmol/L   CO2 23 20 - 29 mmol/L   Calcium 9.5 8.6 - 10.2 mg/dL   Total Protein 7.4 6.0 - 8.5 g/dL   Albumin 4.8 (H) 3.7 - 4.7 g/dL   Globulin, Total 2.6 1.5 - 4.5 g/dL   Albumin/Globulin Ratio 1.8 1.2 - 2.2   Bilirubin Total 1.1 0.0 - 1.2 mg/dL   Alkaline Phosphatase 137 (H) 44 - 121 IU/L   AST 33 0 - 40 IU/L   ALT 28 0 - 44 IU/L  CBC with Differential/Platelet  Result Value Ref Range   WBC 5.0 3.4 - 10.8 x10E3/uL   RBC 5.42 4.14 - 5.80 x10E6/uL   Hemoglobin 16.8 13.0 - 17.7 g/dL   Hematocrit 51.0 37.5 - 51.0 %   MCV 94 79 - 97 fL   MCH 31.0 26.6 - 33.0 pg   MCHC 32.9 31.5 - 35.7 g/dL   RDW 12.5 11.6 -  15.4 %   Platelets 219 150 - 450 x10E3/uL   Neutrophils 61 Not Estab. %   Lymphs 28 Not Estab. %   Monocytes 9 Not Estab. %   Eos 1 Not Estab. %   Basos 0 Not Estab. %   Neutrophils Absolute 3.0 1.4 - 7.0  x10E3/uL   Lymphocytes Absolute 1.4 0.7 - 3.1 x10E3/uL   Monocytes Absolute 0.4 0.1 - 0.9 x10E3/uL   EOS (ABSOLUTE) 0.1 0.0 - 0.4 x10E3/uL   Basophils Absolute 0.0 0.0 - 0.2 x10E3/uL   Immature Granulocytes 1 Not Estab. %   Immature Grans (Abs) 0.1 0.0 - 0.1 x10E3/uL  Lipid Panel w/o Chol/HDL Ratio  Result Value Ref Range   Cholesterol, Total 180 100 - 199 mg/dL   Triglycerides 146 0 - 149 mg/dL   HDL 48 >39 mg/dL   VLDL Cholesterol Cal 26 5 - 40 mg/dL   LDL Chol Calc (NIH) 106 (H) 0 - 99 mg/dL  PSA  Result Value Ref Range   Prostate Specific Ag, Serum 2.2 0.0 - 4.0 ng/mL  TSH  Result Value Ref Range   TSH 2.890 0.450 - 4.500 uIU/mL  Urinalysis, Routine w reflex microscopic  Result Value Ref Range   Specific Gravity, UA 1.015 1.005 - 1.030   pH, UA 6.0 5.0 - 7.5   Color, UA Yellow Yellow   Appearance Ur Clear Clear   Leukocytes,UA Negative Negative   Protein,UA Negative Negative/Trace   Glucose, UA Negative Negative   Ketones, UA Negative Negative   RBC, UA Negative Negative   Bilirubin, UA Negative Negative   Urobilinogen, Ur 0.2 0.2 - 1.0 mg/dL   Nitrite, UA Negative Negative  Microalbumin, Urine Waived  Result Value Ref Range   Microalb, Ur Waived 10 0 - 19 mg/L   Creatinine, Urine Waived 50 10 - 300 mg/dL   Microalb/Creat Ratio <30 <30 mg/g  Bayer DCA Hb A1c Waived  Result Value Ref Range   HB A1C (BAYER DCA - WAIVED) 5.8 (H) 4.8 - 5.6 %      Assessment & Plan:   Problem List Items Addressed This Visit       Endocrine   IFG (impaired fasting glucose) - Primary    Rechecking labs today. Await results. Treat as needed.       Relevant Orders   Bayer DCA Hb A1c Waived     Other   Depression    Under good control on current regimen. Continue current regimen. Continue to monitor. Call with any concerns. Refills through the [place].       Hyperlipidemia    Under good control on current regimen. Continue current regimen. Continue to monitor. Call with any  concerns. Refills through the [place]. Labs drawn today.       Relevant Orders   Comprehensive metabolic panel   Lipid Panel w/o Chol/HDL Ratio   Other Visit Diagnoses     Need for hepatitis C screening test       Labs drawn today. Await results.   Relevant Orders   Hepatitis C Antibody        Follow up plan: Return in about 6 months (around 06/10/2022) for physical.

## 2021-12-10 NOTE — Assessment & Plan Note (Addendum)
Under good control on current regimen. Continue current regimen. Continue to monitor. Call with any concerns. Refills through the [place].

## 2021-12-11 LAB — LIPID PANEL W/O CHOL/HDL RATIO
Cholesterol, Total: 148 mg/dL (ref 100–199)
HDL: 44 mg/dL (ref >39)
LDL Chol Calc (NIH): 74 mg/dL (ref 0–99)
Triglycerides: 179 mg/dL — ABNORMAL HIGH (ref 0–149)
VLDL Cholesterol Cal: 30 mg/dL (ref 5–40)

## 2021-12-11 LAB — COMPREHENSIVE METABOLIC PANEL
ALT: 34 IU/L (ref 0–44)
AST: 34 IU/L (ref 0–40)
Albumin/Globulin Ratio: 1.8 (ref 1.2–2.2)
Albumin: 4.4 g/dL (ref 3.8–4.8)
Alkaline Phosphatase: 124 IU/L — ABNORMAL HIGH (ref 44–121)
BUN/Creatinine Ratio: 18 (ref 10–24)
BUN: 18 mg/dL (ref 8–27)
Bilirubin Total: 1 mg/dL (ref 0.0–1.2)
CO2: 23 mmol/L (ref 20–29)
Calcium: 9.1 mg/dL (ref 8.6–10.2)
Chloride: 104 mmol/L (ref 96–106)
Creatinine, Ser: 1.02 mg/dL (ref 0.76–1.27)
Globulin, Total: 2.5 g/dL (ref 1.5–4.5)
Glucose: 92 mg/dL (ref 70–99)
Potassium: 4.3 mmol/L (ref 3.5–5.2)
Sodium: 142 mmol/L (ref 134–144)
Total Protein: 6.9 g/dL (ref 6.0–8.5)
eGFR: 78 mL/min/{1.73_m2} (ref >59)

## 2021-12-11 LAB — HEPATITIS C ANTIBODY: Hep C Virus Ab: NONREACTIVE

## 2021-12-13 ENCOUNTER — Other Ambulatory Visit: Payer: Self-pay | Admitting: Family Medicine

## 2021-12-13 DIAGNOSIS — R7301 Impaired fasting glucose: Secondary | ICD-10-CM

## 2021-12-29 DIAGNOSIS — H5203 Hypermetropia, bilateral: Secondary | ICD-10-CM | POA: Diagnosis not present

## 2022-02-07 ENCOUNTER — Other Ambulatory Visit: Payer: Self-pay | Admitting: Family Medicine

## 2022-02-08 NOTE — Telephone Encounter (Signed)
Requested Prescriptions  Pending Prescriptions Disp Refills   buPROPion (WELLBUTRIN XL) 300 MG 24 hr tablet [Pharmacy Med Name: BUPROPION HCL XL 300 MG TABLET] 90 tablet 1    Sig: TAKE 1 TABLET(300 MG) BY MOUTH DAILY     Psychiatry: Antidepressants - bupropion Passed - 02/07/2022  4:33 AM      Passed - Cr in normal range and within 360 days    Creat  Date Value Ref Range Status  01/19/2013 1.06 0.50 - 1.35 mg/dL Final   Creatinine, Ser  Date Value Ref Range Status  12/10/2021 1.02 0.76 - 1.27 mg/dL Final         Passed - AST in normal range and within 360 days    AST  Date Value Ref Range Status  12/10/2021 34 0 - 40 IU/L Final         Passed - ALT in normal range and within 360 days    ALT  Date Value Ref Range Status  12/10/2021 34 0 - 44 IU/L Final         Passed - Completed PHQ-2 or PHQ-9 in the last 360 days      Passed - Last BP in normal range    BP Readings from Last 1 Encounters:  12/10/21 133/71         Passed - Valid encounter within last 6 months    Recent Outpatient Visits           2 months ago IFG (impaired fasting glucose)   [facility] [place], Megan P, DO   8 months ago Routine general medical examination at a health care facility   [place], [place] P, DO   1 year ago Acute non-recurrent maxillary sinusitis   [facility] McElwee, Lauren A, NP   1 year ago IFG (impaired fasting glucose)   [facility] [place], Megan P, DO   1 year ago Rib pain   [facility] McElwee, Scheryl Darter, NP       Future Appointments             In 4 months Johnson, Barb Merino, DO [employer], [facility]

## 2022-02-25 ENCOUNTER — Encounter: Payer: Self-pay | Admitting: Family Medicine

## 2022-02-25 ENCOUNTER — Ambulatory Visit (INDEPENDENT_AMBULATORY_CARE_PROVIDER_SITE_OTHER): Payer: Medicare HMO | Admitting: Family Medicine

## 2022-02-25 VITALS — BP 130/71 | HR 73 | Temp 97.4°F | Wt 185.0 lb

## 2022-02-25 DIAGNOSIS — J45909 Unspecified asthma, uncomplicated: Secondary | ICD-10-CM | POA: Insufficient documentation

## 2022-02-25 DIAGNOSIS — J4520 Mild intermittent asthma, uncomplicated: Secondary | ICD-10-CM | POA: Diagnosis not present

## 2022-02-25 DIAGNOSIS — R0602 Shortness of breath: Secondary | ICD-10-CM | POA: Diagnosis not present

## 2022-02-25 MED ORDER — BUDESONIDE-FORMOTEROL FUMARATE 160-4.5 MCG/ACT IN AERO: 2.0000 | INHALATION_SPRAY | Freq: Two times a day (BID) | RESPIRATORY_TRACT | 3 refills | Status: DC

## 2022-02-25 MED ORDER — ALBUTEROL SULFATE HFA 108 (90 BASE) MCG/ACT IN AERS: 2.0000 | INHALATION_SPRAY | Freq: Four times a day (QID) | RESPIRATORY_TRACT | 0 refills | Status: DC | PRN

## 2022-02-25 NOTE — Progress Notes (Signed)
BP 130/71   Pulse 73   Temp (!) 97.4 F (36.3 C) (Oral)   Wt 185 lb (83.9 kg)   SpO2 97%   BMI 27.32 kg/m    Subjective:    Patient ID: Wayne Robles, male    DOB: [birth year], 77 y.o.   MRN: [id number]  HPI: Wayne Robles is a 77 y.o. male  Chief Complaint  Patient presents with   Shortness of Breath    Pt states he has been having more trouble breathing lately. States he finds himself getting more winded when he bends over or walks for a period of time. States his wife told him this has been going on for a while.    SHORTNESS OF BREATH Duration: months Onset: gradual Description of breathing discomfort: winded Severity: mild Episode duration: 3-5 minutes Frequency: with activity Related to exertion: yes Cough: no Chest tightness: no Wheezing: no Fevers: no Chest pain: no Palpitations: no  Nausea: no Diaphoresis: no Deconditioning: yes Status: better this week than last week  Relevant past medical, surgical, family and social history reviewed and updated as indicated. Interim medical history since our last visit reviewed. Allergies and medications reviewed and updated.  Review of Systems  Constitutional: Negative.   Respiratory:  Positive for shortness of breath. Negative for apnea, cough, choking, chest tightness, wheezing and stridor.   Cardiovascular: Negative.   Gastrointestinal: Negative.   Musculoskeletal: Negative.   Neurological:  Positive for dizziness. Negative for tremors, seizures, syncope, facial asymmetry, speech difficulty, weakness, light-headedness, numbness and headaches.  Psychiatric/Behavioral: Negative.      Per HPI unless specifically indicated above     Objective:    BP 130/71   Pulse 73   Temp (!) 97.4 F (36.3 C) (Oral)   Wt 185 lb (83.9 kg)   SpO2 97%   BMI 27.32 kg/m   Wt Readings from Last 3 Encounters:  02/25/22 185 lb (83.9 kg)  12/10/21 185 lb 8 oz (84.1 kg)  06/18/21 186 lb (84.4 kg)    Physical Exam Vitals  and nursing note reviewed.  Constitutional:      General: He is not in acute distress.    Appearance: Normal appearance. He is well-developed and normal weight. He is not ill-appearing, toxic-appearing or diaphoretic.  HENT:     Head: Normocephalic and atraumatic.     Right Ear: External ear normal.     Left Ear: External ear normal.     Nose: Nose normal.     Mouth/Throat:     Mouth: Mucous membranes are moist.     Pharynx: Oropharynx is clear.  Eyes:     General: No scleral icterus.       Right eye: No discharge.        Left eye: No discharge.     Extraocular Movements: Extraocular movements intact.     Conjunctiva/sclera: Conjunctivae normal.     Pupils: Pupils are equal, round, and reactive to light.  Cardiovascular:     Rate and Rhythm: Normal rate and regular rhythm.     Pulses: Normal pulses.     Heart sounds: Normal heart sounds. No murmur heard.    No friction rub. No gallop.  Pulmonary:     Effort: Pulmonary effort is normal. No respiratory distress.     Breath sounds: Normal breath sounds. No stridor. No wheezing, rhonchi or rales.  Chest:     Chest wall: No tenderness.  Musculoskeletal:        General: Normal range  of motion.     Cervical back: Normal range of motion and neck supple.  Skin:    General: Skin is warm and dry.     Capillary Refill: Capillary refill takes less than 2 seconds.     Coloration: Skin is not jaundiced or pale.     Findings: No bruising, erythema, lesion or rash.  Neurological:     General: No focal deficit present.     Mental Status: He is alert and oriented to person, place, and time. Mental status is at baseline.  Psychiatric:        Mood and Affect: Mood normal.        Behavior: Behavior normal.        Thought Content: Thought content normal.        Judgment: Judgment normal.     Results for orders placed or performed in visit on 12/10/21  Comprehensive metabolic panel  Result Value Ref Range   Glucose 92 70 - 99 mg/dL   BUN  18 8 - 27 mg/dL   Creatinine, Ser 1.02 0.76 - 1.27 mg/dL   eGFR 78 >59 mL/min/1.73   BUN/Creatinine Ratio 18 10 - 24   Sodium 142 134 - 144 mmol/L   Potassium 4.3 3.5 - 5.2 mmol/L   Chloride 104 96 - 106 mmol/L   CO2 23 20 - 29 mmol/L   Calcium 9.1 8.6 - 10.2 mg/dL   Total Protein 6.9 6.0 - 8.5 g/dL   Albumin 4.4 3.8 - 4.8 g/dL   Globulin, Total 2.5 1.5 - 4.5 g/dL   Albumin/Globulin Ratio 1.8 1.2 - 2.2   Bilirubin Total 1.0 0.0 - 1.2 mg/dL   Alkaline Phosphatase 124 (H) 44 - 121 IU/L   AST 34 0 - 40 IU/L   ALT 34 0 - 44 IU/L  Lipid Panel w/o Chol/HDL Ratio  Result Value Ref Range   Cholesterol, Total 148 100 - 199 mg/dL   Triglycerides 179 (H) 0 - 149 mg/dL   HDL 44 >39 mg/dL   VLDL Cholesterol Cal 30 5 - 40 mg/dL   LDL Chol Calc (NIH) 74 0 - 99 mg/dL  Hepatitis C Antibody  Result Value Ref Range   Hep C Virus Ab Non Reactive Non Reactive  Bayer DCA Hb A1c Waived  Result Value Ref Range   HB A1C (BAYER DCA - WAIVED) 6.3 (H) 4.8 - 5.6 %      Assessment & Plan:   Problem List Items Addressed This Visit       Respiratory   Asthma    Will start symbicort and albuterol. Recheck 1 month. If not better in 1 month will get him back into cardiology.      Relevant Medications   budesonide-formoterol (SYMBICORT) 160-4.5 MCG/ACT inhaler   albuterol (VENTOLIN HFA) 108 (90 Base) MCG/ACT inhaler   Other Visit Diagnoses     SOB (shortness of breath)    -  Primary   Mild restriction on spiro.   Relevant Orders   Spirometry with graph (Completed)        Follow up plan: Return in about 4 weeks (around 03/25/2022).

## 2022-02-25 NOTE — Assessment & Plan Note (Signed)
Will start symbicort and albuterol. Recheck 1 month. If not better in 1 month will get him back into cardiology.

## 2022-03-02 ENCOUNTER — Encounter: Payer: Self-pay | Admitting: Family Medicine

## 2022-03-15 ENCOUNTER — Telehealth: Payer: Self-pay | Admitting: Family Medicine

## 2022-03-15 DIAGNOSIS — H906 Mixed conductive and sensorineural hearing loss, bilateral: Secondary | ICD-10-CM | POA: Diagnosis not present

## 2022-03-15 NOTE — Telephone Encounter (Signed)
Copied from [place] [id number]. Topic: Medicare AWV >> Mar 15, 2022 11:53 AM Devoria Glassing wrote: Reason for CRM: Left message for patient to schedule Annual Wellness Visit.  Please schedule with Health Nurse Advisor Kirke Shaggy at [facility].Call [place] at [phone]

## 2022-03-19 ENCOUNTER — Other Ambulatory Visit: Payer: Self-pay | Admitting: Family Medicine

## 2022-03-21 NOTE — Telephone Encounter (Signed)
Requested Prescriptions  Pending Prescriptions Disp Refills   albuterol (VENTOLIN HFA) 108 (90 Base) MCG/ACT inhaler [Pharmacy Med Name: ALBUTEROL HFA (VENTOLIN) INH] 8 each 0    Sig: TAKE 2 PUFFS BY MOUTH EVERY 6 HOURS AS NEEDED FOR WHEEZE OR SHORTNESS OF BREATH     Pulmonology:  Beta Agonists 2 Passed - 03/19/2022  8:19 AM      Passed - Last BP in normal range    BP Readings from Last 1 Encounters:  02/25/22 130/71         Passed - Last Heart Rate in normal range    Pulse Readings from Last 1 Encounters:  02/25/22 73         Passed - Valid encounter within last 12 months    Recent Outpatient Visits           3 weeks ago SOB (shortness of breath)   [facility] [place], Megan P, DO   3 months ago IFG (impaired fasting glucose)   [place] [facility] [place], [place], DO   9 months ago Routine general medical examination at a health care facility   [place], [place], DO   1 year ago Acute non-recurrent maxillary sinusitis   [place] McElwee, Lauren A, NP   1 year ago IFG (impaired fasting glucose)   [place] [facility] [place], Barb Merino, DO       Future Appointments             In 2 weeks Wynetta Emery, Barb Merino, DO [place], [place]   In 2 months Wynetta Emery, Barb Merino, DO [place], [facility]

## 2022-03-29 ENCOUNTER — Ambulatory Visit: Payer: Medicare HMO | Admitting: Family Medicine

## 2022-04-04 ENCOUNTER — Ambulatory Visit (INDEPENDENT_AMBULATORY_CARE_PROVIDER_SITE_OTHER): Payer: Medicare HMO | Admitting: Family Medicine

## 2022-04-04 ENCOUNTER — Encounter: Payer: Self-pay | Admitting: Family Medicine

## 2022-04-04 VITALS — BP 128/72 | HR 65 | Temp 98.0°F | Ht 69.0 in | Wt 186.9 lb

## 2022-04-04 DIAGNOSIS — J4520 Mild intermittent asthma, uncomplicated: Secondary | ICD-10-CM | POA: Diagnosis not present

## 2022-04-04 MED ORDER — BUDESONIDE-FORMOTEROL FUMARATE 160-4.5 MCG/ACT IN AERO: 2.0000 | INHALATION_SPRAY | Freq: Two times a day (BID) | RESPIRATORY_TRACT | 12 refills | Status: DC

## 2022-04-04 NOTE — Progress Notes (Signed)
BP 128/72   Pulse 65   Temp 98 F (36.7 C) (Oral)   Ht '5\' 9"'$  (1.753 m)   Wt 186 lb 14.4 oz (84.8 kg)   SpO2 97%   BMI 27.60 kg/m    Subjective:    Patient ID: Wayne Robles, male    DOB: Sep 19, 1945, 77 y.o.   MRN: [id number]  HPI: Wayne Robles is a 77 y.o. male  Chief Complaint  Patient presents with   Asthma     ASTHMA Using Symbicort inhaler less than the recommended frequency. Using 1 puff x1 day Asthma status: stable Satisfied with current treatment?: yes Albuterol/rescue inhaler frequency: None at all Dyspnea frequency: Depends on activity, has been a little more active Wheezing frequency: None Cough frequency: None Nocturnal symptom frequency: None Limitation of activity: no Current upper respiratory symptoms: no Triggers: None that he can identify Last Spirometry: None Failed/intolerant to following asthma meds: None Asthma meds in past: None Aerochamber/spacer use: no Visits to ER or Urgent Care in past year: no Pneumovax: Up to Date Influenza: Up to Date   Relevant past medical, surgical, family and social history reviewed and updated as indicated. Interim medical history since our last visit reviewed. Allergies and medications reviewed and updated.  Review of Systems  Respiratory:  Negative for apnea, cough, choking, chest tightness, shortness of breath, wheezing and stridor.   Cardiovascular:  Negative for chest pain and palpitations.    Per HPI unless specifically indicated above     Objective:    BP 128/72   Pulse 65   Temp 98 F (36.7 C) (Oral)   Ht '5\' 9"'$  (1.753 m)   Wt 186 lb 14.4 oz (84.8 kg)   SpO2 97%   BMI 27.60 kg/m   Wt Readings from Last 3 Encounters:  04/04/22 186 lb 14.4 oz (84.8 kg)  02/25/22 185 lb (83.9 kg)  12/10/21 185 lb 8 oz (84.1 kg)    Physical Exam Vitals and nursing note reviewed.  Constitutional:      General: He is awake. He is not in acute distress.    Appearance: He is well-developed and  well-groomed. He is not ill-appearing.  HENT:     Head: Normocephalic and atraumatic.     Right Ear: Hearing and external ear normal. No drainage.     Left Ear: Hearing and external ear normal. No drainage.     Nose: Nose normal.  Eyes:     General: Lids are normal.        Right eye: No discharge.        Left eye: No discharge.     Conjunctiva/sclera: Conjunctivae normal.  Cardiovascular:     Rate and Rhythm: Normal rate and regular rhythm.     Heart sounds: Normal heart sounds, S1 normal and S2 normal. No murmur heard.    No gallop.  Pulmonary:     Effort: No accessory muscle usage or respiratory distress.     Breath sounds: Examination of the right-upper field reveals wheezing. Examination of the left-upper field reveals wheezing. Wheezing present.     Comments: Audible Wheezing Musculoskeletal:        General: Normal range of motion.     Right lower leg: No edema.     Left lower leg: No edema.  Skin:    General: Skin is warm and dry.     Capillary Refill: Capillary refill takes less than 2 seconds.  Neurological:     Mental Status: He is alert  and oriented to person, place, and time.  Psychiatric:        Attention and Perception: Attention normal.        Mood and Affect: Mood normal.        Speech: Speech normal.        Behavior: Behavior normal. Behavior is cooperative.        Thought Content: Thought content normal.     Results for orders placed or performed in visit on 12/10/21  Comprehensive metabolic panel  Result Value Ref Range   Glucose 92 70 - 99 mg/dL   BUN 18 8 - 27 mg/dL   Creatinine, Ser 1.02 0.76 - 1.27 mg/dL   eGFR 78 >59 mL/min/1.73   BUN/Creatinine Ratio 18 10 - 24   Sodium 142 134 - 144 mmol/L   Potassium 4.3 3.5 - 5.2 mmol/L   Chloride 104 96 - 106 mmol/L   CO2 23 20 - 29 mmol/L   Calcium 9.1 8.6 - 10.2 mg/dL   Total Protein 6.9 6.0 - 8.5 g/dL   Albumin 4.4 3.8 - 4.8 g/dL   Globulin, Total 2.5 1.5 - 4.5 g/dL   Albumin/Globulin Ratio 1.8 1.2 -  2.2   Bilirubin Total 1.0 0.0 - 1.2 mg/dL   Alkaline Phosphatase 124 (H) 44 - 121 IU/L   AST 34 0 - 40 IU/L   ALT 34 0 - 44 IU/L  Lipid Panel w/o Chol/HDL Ratio  Result Value Ref Range   Cholesterol, Total 148 100 - 199 mg/dL   Triglycerides 179 (H) 0 - 149 mg/dL   HDL 44 >39 mg/dL   VLDL Cholesterol Cal 30 5 - 40 mg/dL   LDL Chol Calc (NIH) 74 0 - 99 mg/dL  Hepatitis C Antibody  Result Value Ref Range   Hep C Virus Ab Non Reactive Non Reactive  Bayer DCA Hb A1c Waived  Result Value Ref Range   HB A1C (BAYER DCA - WAIVED) 6.3 (H) 4.8 - 5.6 %      Assessment & Plan:   Problem List Items Addressed This Visit       Respiratory   Asthma - Primary    Chronic, stable. Continue Albuterol PRN and Symbicort daily, educated to use symbicort x2 puffs, twice a day. Recommend use of albuterol before activity as needed. Return as needed if symptoms worsen.       Relevant Medications   budesonide-formoterol (SYMBICORT) 160-4.5 MCG/ACT inhaler     Follow up plan: Return After May 3, for physical.

## 2022-04-04 NOTE — Assessment & Plan Note (Addendum)
Chronic, stable. Continue Albuterol PRN and Symbicort daily, educated to use symbicort x2 puffs, twice a day. Recommend use of albuterol before activity as needed. Return as needed if symptoms worsen.

## 2022-04-04 NOTE — Progress Notes (Signed)
BP 128/72   Pulse 65   Temp 98 F (36.7 C) (Oral)   Ht '5\' 9"'$  (1.753 m)   Wt 186 lb 14.4 oz (84.8 kg)   SpO2 97%   BMI 27.60 kg/m    Subjective:    Patient ID: Wayne Robles, male    DOB: Apr 16, 1945, 78 y.o.   MRN: [id number]  HPI: Wayne Robles is a 77 y.o. male  Chief Complaint  Patient presents with   Asthma   ASTHMA- has only been doing 1 puff 1x a day Asthma status: better Satisfied with current treatment?: yes Albuterol/rescue inhaler frequency:  Dyspnea frequency: depending on activity Wheezing frequency: denies any Cough frequency: none Nocturnal symptom frequency: never  Limitation of activity: no Current upper respiratory symptoms: no Aerochamber/spacer use: no Visits to ER or Urgent Care in past year: no Pneumovax: Up to Date Influenza: Up to Date  Relevant past medical, surgical, family and social history reviewed and updated as indicated. Interim medical history since our last visit reviewed. Allergies and medications reviewed and updated.  Review of Systems  Constitutional: Negative.   Respiratory: Negative.    Cardiovascular: Negative.   Gastrointestinal: Negative.   Musculoskeletal: Negative.   Psychiatric/Behavioral: Negative.      Per HPI unless specifically indicated above     Objective:    BP 128/72   Pulse 65   Temp 98 F (36.7 C) (Oral)   Ht '5\' 9"'$  (1.753 m)   Wt 186 lb 14.4 oz (84.8 kg)   SpO2 97%   BMI 27.60 kg/m   Wt Readings from Last 3 Encounters:  04/04/22 186 lb 14.4 oz (84.8 kg)  02/25/22 185 lb (83.9 kg)  12/10/21 185 lb 8 oz (84.1 kg)    Physical Exam Vitals and nursing note reviewed.  Constitutional:      General: He is not in acute distress.    Appearance: Normal appearance. He is normal weight. He is not ill-appearing, toxic-appearing or diaphoretic.  HENT:     Head: Normocephalic and atraumatic.     Right Ear: External ear normal.     Left Ear: External ear normal.     Nose: Nose normal.      Mouth/Throat:     Mouth: Mucous membranes are moist.     Pharynx: Oropharynx is clear.  Eyes:     General: No scleral icterus.       Right eye: No discharge.        Left eye: No discharge.     Extraocular Movements: Extraocular movements intact.     Conjunctiva/sclera: Conjunctivae normal.     Pupils: Pupils are equal, round, and reactive to light.  Cardiovascular:     Rate and Rhythm: Normal rate and regular rhythm.     Pulses: Normal pulses.     Heart sounds: Normal heart sounds. No murmur heard.    No friction rub. No gallop.  Pulmonary:     Effort: Pulmonary effort is normal. No respiratory distress.     Breath sounds: Normal breath sounds. No stridor. No wheezing, rhonchi or rales.  Chest:     Chest wall: No tenderness.  Musculoskeletal:        General: Normal range of motion.     Cervical back: Normal range of motion and neck supple.  Skin:    General: Skin is warm and dry.     Capillary Refill: Capillary refill takes less than 2 seconds.     Coloration: Skin is not jaundiced or  pale.     Findings: No bruising, erythema, lesion or rash.  Neurological:     General: No focal deficit present.     Mental Status: He is alert and oriented to person, place, and time. Mental status is at baseline.  Psychiatric:        Mood and Affect: Mood normal.        Behavior: Behavior normal.        Thought Content: Thought content normal.        Judgment: Judgment normal.     Results for orders placed or performed in visit on 12/10/21  Comprehensive metabolic panel  Result Value Ref Range   Glucose 92 70 - 99 mg/dL   BUN 18 8 - 27 mg/dL   Creatinine, Ser 1.02 0.76 - 1.27 mg/dL   eGFR 78 >59 mL/min/1.73   BUN/Creatinine Ratio 18 10 - 24   Sodium 142 134 - 144 mmol/L   Potassium 4.3 3.5 - 5.2 mmol/L   Chloride 104 96 - 106 mmol/L   CO2 23 20 - 29 mmol/L   Calcium 9.1 8.6 - 10.2 mg/dL   Total Protein 6.9 6.0 - 8.5 g/dL   Albumin 4.4 3.8 - 4.8 g/dL   Globulin, Total 2.5 1.5 - 4.5  g/dL   Albumin/Globulin Ratio 1.8 1.2 - 2.2   Bilirubin Total 1.0 0.0 - 1.2 mg/dL   Alkaline Phosphatase 124 (H) 44 - 121 IU/L   AST 34 0 - 40 IU/L   ALT 34 0 - 44 IU/L  Lipid Panel w/o Chol/HDL Ratio  Result Value Ref Range   Cholesterol, Total 148 100 - 199 mg/dL   Triglycerides 179 (H) 0 - 149 mg/dL   HDL 44 >39 mg/dL   VLDL Cholesterol Cal 30 5 - 40 mg/dL   LDL Chol Calc (NIH) 74 0 - 99 mg/dL  Hepatitis C Antibody  Result Value Ref Range   Hep C Virus Ab Non Reactive Non Reactive  Bayer DCA Hb A1c Waived  Result Value Ref Range   HB A1C (BAYER DCA - WAIVED) 6.3 (H) 4.8 - 5.6 %      Assessment & Plan:   Problem List Items Addressed This Visit       Respiratory   Asthma - Primary    Chronic, stable. Continue Albuterol PRN and Symbicort daily, educated to use symbicort x2 puffs, twice a day. Recommend use of albuterol before activity as needed. Return as needed if symptoms worsen.       Relevant Medications   budesonide-formoterol (SYMBICORT) 160-4.5 MCG/ACT inhaler     Follow up plan: Return After May 3, for physical.

## 2022-04-26 ENCOUNTER — Telehealth: Payer: Self-pay | Admitting: Family Medicine

## 2022-04-26 NOTE — Telephone Encounter (Signed)
Copied from [place] [id number]. Topic: Medicare AWV >> Apr 26, 2022 12:53 PM Devoria Glassing wrote: Reason for CRM: Called patient to schedule Medicare Annual Wellness Visit (AWV). Left message for patient to call back and schedule Medicare Annual Wellness Visit (AWV).  Last date of AWV: 02/17/21  Please schedule an appointment at any time with Kirke Shaggy, LPN .  If any questions, please contact me.  Thank you ,  Sherol Dade; [place] Direct Dial: [phone]  .

## 2022-05-18 ENCOUNTER — Other Ambulatory Visit: Payer: Self-pay | Admitting: Family Medicine

## 2022-05-19 NOTE — Telephone Encounter (Signed)
Requested Prescriptions  Pending Prescriptions Disp Refills   ezetimibe (ZETIA) 10 MG tablet [Pharmacy Med Name: EZETIMIBE 10 MG TABLET] 90 tablet 1    Sig: TAKE 1 TABLET BY MOUTH EVERY DAY     Cardiovascular:  Antilipid - Sterol Transport Inhibitors Failed - 05/18/2022 10:40 AM      Failed - Lipid Panel in normal range within the last 12 months    Cholesterol, Total  Date Value Ref Range Status  12/10/2021 148 100 - 199 mg/dL Final   LDL Chol Calc (NIH)  Date Value Ref Range Status  12/10/2021 74 0 - 99 mg/dL Final   HDL  Date Value Ref Range Status  12/10/2021 44 >39 mg/dL Final   Triglycerides  Date Value Ref Range Status  12/10/2021 179 (H) 0 - 149 mg/dL Final         Passed - AST in normal range and within 360 days    AST  Date Value Ref Range Status  12/10/2021 34 0 - 40 IU/L Final         Passed - ALT in normal range and within 360 days    ALT  Date Value Ref Range Status  12/10/2021 34 0 - 44 IU/L Final         Passed - Patient is not pregnant      Passed - Valid encounter within last 12 months    Recent Outpatient Visits           1 month ago Mild intermittent asthma without complication   [place] [facility] [place], Megan P, DO   2 months ago SOB (shortness of breath)   [place] [facility], Megan P, DO   5 months ago IFG (impaired fasting glucose)   [place] [facility] [place], Megan P, DO   11 months ago Routine general medical examination at a health care facility   [facility] [place], [place] P, DO   1 year ago Acute non-recurrent maxillary sinusitis   [place] [facility] Gerre Scull, NP       Future Appointments             In 3 weeks Dorcas Carrow, DO [place] [facility], [facility]

## 2022-05-23 ENCOUNTER — Telehealth: Payer: Self-pay | Admitting: Family Medicine

## 2022-05-23 NOTE — Telephone Encounter (Signed)
Copied from CRM [id number]. Topic: Medicare AWV >> May 23, 2022  1:23 PM Payton Doughty wrote: Reason for CRM: Called patient to schedule Medicare Annual Wellness Visit (AWV). Left message for patient to call back and schedule Medicare Annual Wellness Visit (AWV).  Last date of AWV: 02/17/21  Please schedule an appointment at any time with Kennedy Bucker, LPN  .  If any questions, please contact me.  Thank you ,  Verlee Rossetti; Care Guide Ambulatory Clinical Support [place] [facility] Direct Dial: [phone]

## 2022-05-27 ENCOUNTER — Telehealth: Payer: Self-pay | Admitting: Family Medicine

## 2022-05-27 NOTE — Telephone Encounter (Signed)
Copied from CRM [id number]. Topic: Medicare AWV >> May 27, 2022  2:10 PM Payton Doughty wrote: Reason for CRM: Called patient to schedule Medicare Annual Wellness Visit (AWV). Left message for patient to call back and schedule Medicare Annual Wellness Visit (AWV).  Last date of AWV: 02/17/21  Please schedule an appointment at any time with Kennedy Bucker, LPN  .  If any questions, please contact me.  Thank you ,  Verlee Rossetti; Care Guide Ambulatory Clinical Support [place] [facility] Direct Dial: [phone]

## 2022-06-10 ENCOUNTER — Encounter: Payer: Medicare HMO | Admitting: Family Medicine

## 2022-06-13 ENCOUNTER — Telehealth: Payer: Self-pay | Admitting: Family Medicine

## 2022-06-13 NOTE — Telephone Encounter (Signed)
Copied from CRM [id number]. Topic: Medicare AWV >> Jun 13, 2022 10:34 AM Payton Doughty wrote: Reason for CRM: Called patient to schedule Medicare Annual Wellness Visit (AWV). Left message for patient to call back and schedule Medicare Annual Wellness Visit (AWV).  Last date of AWV: 02/17/21  Please schedule an appointment at any time with Kennedy Bucker, LPN  .  If any questions, please contact me.  Thank you ,  Verlee Rossetti; Care Guide Ambulatory Clinical Support [place] [facility] Direct Dial: [phone]

## 2022-09-05 ENCOUNTER — Telehealth: Payer: Self-pay

## 2022-09-05 NOTE — Telephone Encounter (Signed)
LVM for patient to call back [phone], or to call PCP office to schedule follow up apt. AS, CMA  

## 2022-10-04 ENCOUNTER — Telehealth: Payer: Self-pay | Admitting: Family Medicine

## 2022-10-04 NOTE — Telephone Encounter (Signed)
Copied from CRM [id number]. Topic: Medicare AWV >> Oct 04, 2022  9:23 AM Payton Doughty wrote: Reason for CRM: LM 10/04/2022 to schedule AWV   Verlee Rossetti; Care Guide Ambulatory Clinical Support [place] [facility] Direct Dial: [phone]

## 2022-10-21 ENCOUNTER — Other Ambulatory Visit: Payer: Self-pay | Admitting: Family Medicine

## 2022-10-21 NOTE — Telephone Encounter (Signed)
Requested Prescriptions  Pending Prescriptions Disp Refills   simvastatin (ZOCOR) 40 MG tablet [Pharmacy Med Name: SIMVASTATIN 40 MG TABLET] 90 tablet 0    Sig: TAKE 1 TABLET BY MOUTH EVERY DAY     Cardiovascular:  Antilipid - Statins Failed - 10/21/2022  1:36 AM      Failed - Lipid Panel in normal range within the last 12 months    Cholesterol, Total  Date Value Ref Range Status  12/10/2021 148 100 - 199 mg/dL Final   LDL Chol Calc (NIH)  Date Value Ref Range Status  12/10/2021 74 0 - 99 mg/dL Final   HDL  Date Value Ref Range Status  12/10/2021 44 >39 mg/dL Final   Triglycerides  Date Value Ref Range Status  12/10/2021 179 (H) 0 - 149 mg/dL Final         Passed - Patient is not pregnant      Passed - Valid encounter within last 12 months    Recent Outpatient Visits           6 months ago Mild intermittent asthma without complication   [place] [facility] [place], Megan P, DO   7 months ago SOB (shortness of breath)   [place] [facility], Megan P, DO   10 months ago IFG (impaired fasting glucose)   [place] [facility] [place], Megan P, DO   1 year ago Routine general medical examination at a health care facility   [facility] [place], [place] P, DO   1 year ago Acute non-recurrent maxillary sinusitis   [place] [facility] Gerre Scull, NP       Future Appointments             In 5 days Dorcas Carrow, DO  [facility], [facility]

## 2022-10-26 ENCOUNTER — Ambulatory Visit (INDEPENDENT_AMBULATORY_CARE_PROVIDER_SITE_OTHER): Payer: Medicare HMO | Admitting: Family Medicine

## 2022-10-26 ENCOUNTER — Encounter: Payer: Self-pay | Admitting: Family Medicine

## 2022-10-26 VITALS — BP 138/75 | HR 63 | Ht 69.6 in | Wt 184.4 lb

## 2022-10-26 DIAGNOSIS — Z23 Encounter for immunization: Secondary | ICD-10-CM | POA: Diagnosis not present

## 2022-10-26 DIAGNOSIS — R7301 Impaired fasting glucose: Secondary | ICD-10-CM

## 2022-10-26 DIAGNOSIS — Z0000 Encounter for general adult medical examination without abnormal findings: Secondary | ICD-10-CM | POA: Diagnosis not present

## 2022-10-26 DIAGNOSIS — R3911 Hesitancy of micturition: Secondary | ICD-10-CM | POA: Diagnosis not present

## 2022-10-26 DIAGNOSIS — I700 Atherosclerosis of aorta: Secondary | ICD-10-CM | POA: Diagnosis not present

## 2022-10-26 DIAGNOSIS — F3341 Major depressive disorder, recurrent, in partial remission: Secondary | ICD-10-CM

## 2022-10-26 DIAGNOSIS — E782 Mixed hyperlipidemia: Secondary | ICD-10-CM

## 2022-10-26 MED ORDER — ALBUTEROL SULFATE HFA 108 (90 BASE) MCG/ACT IN AERS: 1.0000 | INHALATION_SPRAY | Freq: Four times a day (QID) | RESPIRATORY_TRACT | 6 refills | Status: DC | PRN

## 2022-10-26 MED ORDER — MELOXICAM 15 MG PO TABS: 15.0000 mg | ORAL_TABLET | Freq: Every day | ORAL | 1 refills | Status: DC

## 2022-10-26 MED ORDER — BUPROPION HCL ER (XL) 300 MG PO TB24: ORAL_TABLET | ORAL | 1 refills | Status: DC

## 2022-10-26 MED ORDER — SIMVASTATIN 40 MG PO TABS: 40.0000 mg | ORAL_TABLET | Freq: Every day | ORAL | 1 refills | Status: DC

## 2022-10-26 MED ORDER — BUDESONIDE-FORMOTEROL FUMARATE 160-4.5 MCG/ACT IN AERO: 2.0000 | INHALATION_SPRAY | Freq: Two times a day (BID) | RESPIRATORY_TRACT | 12 refills | Status: DC

## 2022-10-26 MED ORDER — TRAZODONE HCL 50 MG PO TABS: 50.0000 mg | ORAL_TABLET | Freq: Every evening | ORAL | 1 refills | Status: DC | PRN

## 2022-10-26 MED ORDER — EZETIMIBE 10 MG PO TABS: 10.0000 mg | ORAL_TABLET | Freq: Every day | ORAL | 1 refills | Status: DC

## 2022-10-26 NOTE — Progress Notes (Unsigned)
BP 138/75   Pulse 63   Ht 5' 9.6" (1.768 m)   Wt 184 lb 6.4 oz (83.6 kg)   SpO2 97%   BMI 26.76 kg/m    Subjective:    Patient ID: Wayne Robles, male    DOB: January 10, 1946, 77 y.o.   MRN: [id number]  HPI: Wayne Robles is a 77 y.o. male presenting on 10/26/2022 for comprehensive medical examination. Current medical complaints include:  Impaired Fasting Glucose HbA1C:  Lab Results  Component Value Date   HGBA1C 6.3 (H) 12/10/2021   Duration of elevated blood sugar:  Polydipsia: {Blank single:19197::"yes","no"} Polyuria: {Blank single:19197::"yes","no"} Weight change: {Blank single:19197::"yes","no"} Visual disturbance: {Blank single:19197::"yes","no"} Glucose Monitoring: {Blank single:19197::"yes","no"}    Accucheck frequency: {Blank single:19197::"Not Checking","Daily","BID","TID"}    Fasting glucose:     Post prandial:  Diabetic Education: {Blank single:19197::"Completed","Not Completed"} Family history of diabetes: {Blank single:19197::"yes","no"}  HYPERLIPIDEMIA Hyperlipidemia status: {Blank single:19197::"excellent compliance","good compliance","fair compliance","poor compliance"} Satisfied with current treatment?  {Blank single:19197::"yes","no"} Side effects:  {Blank single:19197::"yes","no"} Medication compliance: {Blank single:19197::"excellent compliance","good compliance","fair compliance","poor compliance"} Past cholesterol meds: {Blank multiple:19196::"none","atorvastain (lipitor)","lovastatin (mevacor)","pravastatin (pravachol)","rosuvastatin (crestor)","simvastatin (zocor)","vytorin","fenofibrate (tricor)","gemfibrozil","ezetimide (zetia)","niaspan","lovaza"} Supplements: {Blank multiple:19196::"none","fish oil","niacin","red yeast rice"} Aspirin:  {Blank single:19197::"yes","no"} The 10-year ASCVD risk score (Arnett DK, et al., 2019) is: 24.7%   Values used to calculate the score:     Age: 30 years     Sex: Male     Is Non-Hispanic African American:  No     Diabetic: No     Tobacco smoker: No     Systolic Blood Pressure: 138 mmHg     Is BP treated: No     HDL Cholesterol: 44 mg/dL     Total Cholesterol: 148 mg/dL Chest pain:  {Blank ZHYQMV:78469::"GEX","BM"} Coronary artery disease:  {Blank single:19197::"yes","no"} Family history CAD:  {Blank single:19197::"yes","no"} Family history early CAD:  {Blank single:19197::"yes","no"}  DEPRESSION Mood status: {Blank single:19197::"controlled","uncontrolled","better","worse","exacerbated","stable"} Satisfied with current treatment?: {Blank single:19197::"yes","no"} Symptom severity: {Blank single:19197::"mild","moderate","severe"}  Duration of current treatment : {Blank single:19197::"chronic","months","years"} Side effects: {Blank single:19197::"yes","no"} Medication compliance: {Blank single:19197::"excellent compliance","good compliance","fair compliance","poor compliance"} Psychotherapy/counseling: {Blank single:19197::"yes","no"} {Blank single:19197::"current","in the past"} Previous psychiatric medications: {Blank multiple:19196::"abilify","amitryptiline","buspar","celexa","cymbalta","depakote","effexor","lamictal","lexapro","lithium","nortryptiline","paxil","prozac","pristiq (desvenlafaxine","seroquel","wellbutrin","zoloft","zyprexa"} Depressed mood: {Blank single:19197::"yes","no"} Anxious mood: {Blank single:19197::"yes","no"} Anhedonia: {Blank single:19197::"yes","no"} Significant weight loss or gain: {Blank single:19197::"yes","no"} Insomnia: {Blank single:19197::"yes","no"} {Blank single:19197::"hard to fall asleep","hard to stay asleep"} Fatigue: {Blank single:19197::"yes","no"} Feelings of worthlessness or guilt: {Blank single:19197::"yes","no"} Impaired concentration/indecisiveness: {Blank single:19197::"yes","no"} Suicidal ideations: {Blank single:19197::"yes","no"} Hopelessness: {Blank single:19197::"yes","no"} Crying spells: {Blank single:19197::"yes","no"}    10/26/2022     9:08 AM 04/04/2022    8:17 AM 02/25/2022    2:16 PM 12/10/2021    8:05 AM 06/09/2021    8:48 AM  Depression screen PHQ 2/9  Decreased Interest 0 0 0 0 0  Down, Depressed, Hopeless 0 0 1 0 1  PHQ - 2 Score 0 0 1 0 1  Altered sleeping 0 1 1 0 1  Tired, decreased energy 0 0 0 0 1  Change in appetite 0 0 0 0 1  Feeling bad or failure about yourself  1 1 0 0 1  Trouble concentrating 0 0 0 0 1  Moving slowly or fidgety/restless 0 0 0 0 0  Suicidal thoughts 0 1 0 0 1  PHQ-9 Score 1 3 2  0 7  Difficult doing work/chores Not difficult at all Not difficult at all Not difficult at all      He currently lives with: wife Interim Problems from his last visit: no  Functional Status Survey:    FALL  RISK:    10/26/2022    9:08 AM 04/04/2022    8:17 AM 02/25/2022    2:16 PM 12/10/2021    8:07 AM 06/09/2021    8:49 AM  Fall Risk   Falls in the past year? 0 0 0 0 0  Number falls in past yr: 0 0 0 0 0  Injury with Fall? 0 0 0 0 0  Risk for fall due to : No Fall Risks No Fall Risks No Fall Risks No Fall Risks No Fall Risks  Follow up Falls evaluation completed Falls evaluation completed Falls evaluation completed Falls evaluation completed Falls evaluation completed    Depression Screen    10/26/2022    9:08 AM 04/04/2022    8:17 AM 02/25/2022    2:16 PM 12/10/2021    8:05 AM 06/09/2021    8:48 AM  Depression screen PHQ 2/9  Decreased Interest 0 0 0 0 0  Down, Depressed, Hopeless 0 0 1 0 1  PHQ - 2 Score 0 0 1 0 1  Altered sleeping 0 1 1 0 1  Tired, decreased energy 0 0 0 0 1  Change in appetite 0 0 0 0 1  Feeling bad or failure about yourself  1 1 0 0 1  Trouble concentrating 0 0 0 0 1  Moving slowly or fidgety/restless 0 0 0 0 0  Suicidal thoughts 0 1 0 0 1  PHQ-9 Score 1 3 2  0 7  Difficult doing work/chores Not difficult at all Not difficult at all Not difficult at all      Advanced Directives Does patient have a HCPOA?    yes If yes, name and contact information:  Does patient  have a living will or MOST form?  yes  Past Medical History:  Past Medical History:  Diagnosis Date  . Depression   . History of colonic polyps   . Hyperlipidemia   . Hyperthyroidism   . Vitamin D deficiency     Surgical History:  Past Surgical History:  Procedure Laterality Date  . APPENDECTOMY    . tonsillectomy      Medications:  Current Outpatient Medications on File Prior to Visit  Medication Sig  . albuterol (VENTOLIN HFA) 108 (90 Base) MCG/ACT inhaler TAKE 2 PUFFS BY MOUTH EVERY 6 HOURS AS NEEDED FOR WHEEZE OR SHORTNESS OF BREATH  . budesonide-formoterol (SYMBICORT) 160-4.5 MCG/ACT inhaler Inhale 2 puffs into the lungs 2 (two) times daily.  Marland Kitchen buPROPion (WELLBUTRIN XL) 300 MG 24 hr tablet TAKE 1 TABLET(300 MG) BY MOUTH DAILY  . ezetimibe (ZETIA) 10 MG tablet TAKE 1 TABLET BY MOUTH EVERY DAY  . meloxicam (MOBIC) 15 MG tablet Take 1 tablet (15 mg total) by mouth daily.  . Multiple Vitamin (MULTIVITAMIN) tablet Take 1 tablet by mouth daily.  . simvastatin (ZOCOR) 40 MG tablet TAKE 1 TABLET BY MOUTH EVERY DAY  . traZODone (DESYREL) 50 MG tablet TAKE 1/2 TO 1 TABLET AT BEDTIME AS NEEDED FOR SLEEP   No current facility-administered medications on file prior to visit.    Allergies:  No Known Allergies  Social History:  Social History   Socioeconomic History  . Marital status: Married    Spouse name: Not on file  . Number of children: Not on file  . Years of education: Not on file  . Highest education level: Not on file  Occupational History  . Not on file  Tobacco Use  . Smoking status: Never  . Smokeless tobacco: Never  Vaping Use  . Vaping status: Never Used  Substance and Sexual Activity  . Alcohol use: No  . Drug use: No  . Sexual activity: Yes  Other Topics Concern  . Not on file  Social History Narrative  . Not on file   Social Determinants of Health   Financial Resource Strain: Low Risk  (02/17/2021)   Overall Financial Resource Strain (CARDIA)    . Difficulty of Paying Living Expenses: Not hard at all  Food Insecurity: No Food Insecurity (02/17/2021)   Hunger Vital Sign   . Worried About Programme researcher, broadcasting/film/video in the Last Year: Never true   . Ran Out of Food in the Last Year: Never true  Transportation Needs: No Transportation Needs (02/17/2021)   PRAPARE - Transportation   . Lack of Transportation (Medical): No   . Lack of Transportation (Non-Medical): No  Physical Activity: Inactive (02/17/2021)   Exercise Vital Sign   . Days of Exercise per Week: 0 days   . Minutes of Exercise per Session: 0 min  Stress: No Stress Concern Present (02/17/2021)   [employer] of Occupational Health - Occupational Stress Questionnaire   . Feeling of Stress : Not at all  Social Connections: Moderately Integrated (02/17/2021)   Social Connection and Isolation Panel [NHANES]   . Frequency of Communication with Friends and Family: Three times a week   . Frequency of Social Gatherings with Friends and Family: Twice a week   . Attends Religious Services: Never   . Active Member of Clubs or Organizations: No   . Attends Banker Meetings: 1 to 4 times per year   . Marital Status: Married  Catering manager Violence: Not At Risk (02/17/2021)   Humiliation, Afraid, Rape, and Kick questionnaire   . Fear of Current or Ex-Partner: No   . Emotionally Abused: No   . Physically Abused: No   . Sexually Abused: No   Social History   Tobacco Use  Smoking Status Never  Smokeless Tobacco Never   Social History   Substance and Sexual Activity  Alcohol Use No    Family History:  Family History  Problem Relation Age of Onset  . Cancer Mother        Ovarian  . Diabetes Father   . Cancer Father        Prostate  . Anxiety disorder Daughter     Past medical history, surgical history, medications, allergies, family history and social history reviewed with patient today and changes made to appropriate areas of the chart.   Review of Systems   Constitutional: Negative.   HENT: Negative.    Eyes: Negative.   Respiratory: Negative.    Cardiovascular: Negative.   Gastrointestinal: Negative.   Genitourinary: Negative.   Musculoskeletal: Negative.   Skin: Negative.   Neurological: Negative.   Endo/Heme/Allergies:  Negative for environmental allergies and polydipsia. Bruises/bleeds easily.  Psychiatric/Behavioral: Negative.     All other ROS negative except what is listed above and in the HPI.      Objective:    BP 138/75   Pulse 63   Ht 5' 9.6" (1.768 m)   Wt 184 lb 6.4 oz (83.6 kg)   SpO2 97%   BMI 26.76 kg/m   Wt Readings from Last 3 Encounters:  10/26/22 184 lb 6.4 oz (83.6 kg)  04/04/22 186 lb 14.4 oz (84.8 kg)  02/25/22 185 lb (83.9 kg)     Physical Exam      No data to display  Results for orders placed or performed in visit on 12/10/21  Comprehensive metabolic panel  Result Value Ref Range   Glucose 92 70 - 99 mg/dL   BUN 18 8 - 27 mg/dL   Creatinine, Ser 0.73 0.76 - 1.27 mg/dL   eGFR 78 >71 GG/YIR/4.85   BUN/Creatinine Ratio 18 10 - 24   Sodium 142 134 - 144 mmol/L   Potassium 4.3 3.5 - 5.2 mmol/L   Chloride 104 96 - 106 mmol/L   CO2 23 20 - 29 mmol/L   Calcium 9.1 8.6 - 10.2 mg/dL   Total Protein 6.9 6.0 - 8.5 g/dL   Albumin 4.4 3.8 - 4.8 g/dL   Globulin, Total 2.5 1.5 - 4.5 g/dL   Albumin/Globulin Ratio 1.8 1.2 - 2.2   Bilirubin Total 1.0 0.0 - 1.2 mg/dL   Alkaline Phosphatase 124 (H) 44 - 121 IU/L   AST 34 0 - 40 IU/L   ALT 34 0 - 44 IU/L  Lipid Panel w/o Chol/HDL Ratio  Result Value Ref Range   Cholesterol, Total 148 100 - 199 mg/dL   Triglycerides 462 (H) 0 - 149 mg/dL   HDL 44 >70 mg/dL   VLDL Cholesterol Cal 30 5 - 40 mg/dL   LDL Chol Calc (NIH) 74 0 - 99 mg/dL  Hepatitis C Antibody  Result Value Ref Range   Hep C Virus Ab Non Reactive Non Reactive  Bayer DCA Hb A1c Waived  Result Value Ref Range   HB A1C (BAYER DCA - WAIVED) 6.3 (H) 4.8 - 5.6 %       Assessment & Plan:   Problem List Items Addressed This Visit   None Visit Diagnoses     Flu vaccine need    -  Primary   Relevant Orders   Flu Vaccine Trivalent High Dose (Fluad) (Completed)        Preventative Services:  Health Risk Assessment and Personalized Prevention Plan: done today Bone Mass Measurements: N/A CVD Screening: Done today Colon Cancer Screening: up to date Depression Screening: Done today Diabetes Screening: Done today Glaucoma Screening: See your eye doctor Hepatitis B vaccine: N/A Hepatitis C screening: Up to date HIV Screening: up to date Flu Vaccine: given today Lung cancer Screening: N/A Obesity Screening: done today Pneumonia Vaccines: up to date STI Screening: N/A PSA screening: N/A  LABORATORY TESTING:  Health maintenance labs ordered today as discussed above.   The natural history of prostate cancer and ongoing controversy regarding screening and potential treatment outcomes of prostate cancer has been discussed with the patient. The meaning of a false positive PSA and a false negative PSA has been discussed. He indicates understanding of the limitations of this screening test and wishes to proceed with screening PSA testing.   IMMUNIZATIONS:   - Tdap: Tetanus vaccination status reviewed: {tetanus status:315746}. - Influenza: {Blank single:19197::"Up to date","Administered today","Postponed to flu season","Refused","Given elsewhere"} - Pneumovax: {Blank single:19197::"Up to date","Administered today","Not applicable","Refused","Given elsewhere"} - Prevnar: {Blank single:19197::"Up to date","Administered today","Not applicable","Refused","Given elsewhere"} - Zostavax vaccine: {Blank single:19197::"Up to date","Administered today","Not applicable","Refused","Given elsewhere"}  SCREENING: - Colonoscopy: Up to date  Discussed with patient purpose of the colonoscopy is to detect colon cancer at curable precancerous or early stages   PATIENT  COUNSELING:    Sexuality: Discussed sexually transmitted diseases, partner selection, use of condoms, avoidance of unintended pregnancy  and contraceptive alternatives.   Advised to avoid cigarette smoking.  I discussed with the patient that most people either abstain from alcohol or drink within safe limits (<=  14/week and <=4 drinks/occasion for males, <=7/weeks and <= 3 drinks/occasion for females) and that the risk for alcohol disorders and other health effects rises proportionally with the number of drinks per week and how often a drinker exceeds daily limits.  Discussed cessation/primary prevention of drug use and availability of treatment for abuse.   Diet: Encouraged to adjust caloric intake to maintain  or achieve ideal body weight, to reduce intake of dietary saturated fat and total fat, to limit sodium intake by avoiding high sodium foods and not adding table salt, and to maintain adequate dietary potassium and calcium preferably from fresh fruits, vegetables, and low-fat dairy products.    stressed the importance of regular exercise  Injury prevention: Discussed safety belts, safety helmets, smoke detector, smoking near bedding or upholstery.   Dental health: Discussed importance of regular tooth brushing, flossing, and dental visits.   Follow up plan: NEXT PREVENTATIVE PHYSICAL DUE IN 1 YEAR. No follow-ups on file.

## 2022-10-26 NOTE — Patient Instructions (Signed)
Preventative Services:  Health Risk Assessment and Personalized Prevention Plan: done today Bone Mass Measurements: N/A CVD Screening: Done today Colon Cancer Screening: up to date Depression Screening: Done today Diabetes Screening: Done today Glaucoma Screening: See your eye doctor Hepatitis B vaccine: N/A Hepatitis C screening: Up to date HIV Screening: up to date Flu Vaccine: given today Lung cancer Screening: N/A Obesity Screening: done today Pneumonia Vaccines: up to date STI Screening: N/A PSA screening: N/A

## 2022-10-27 ENCOUNTER — Encounter: Payer: Self-pay | Admitting: Family Medicine

## 2022-10-27 LAB — LIPID PANEL W/O CHOL/HDL RATIO
Cholesterol, Total: 135 mg/dL (ref 100–199)
HDL: 45 mg/dL (ref >39)
LDL Chol Calc (NIH): 68 mg/dL (ref 0–99)
Triglycerides: 120 mg/dL (ref 0–149)
VLDL Cholesterol Cal: 22 mg/dL (ref 5–40)

## 2022-10-27 LAB — COMPREHENSIVE METABOLIC PANEL
ALT: 26 IU/L (ref 0–44)
AST: 31 IU/L (ref 0–40)
Albumin: 4.4 g/dL (ref 3.8–4.8)
Alkaline Phosphatase: 125 IU/L — ABNORMAL HIGH (ref 44–121)
BUN/Creatinine Ratio: 15 (ref 10–24)
BUN: 16 mg/dL (ref 8–27)
Bilirubin Total: 1.1 mg/dL (ref 0.0–1.2)
CO2: 25 mmol/L (ref 20–29)
Calcium: 9.6 mg/dL (ref 8.6–10.2)
Chloride: 100 mmol/L (ref 96–106)
Creatinine, Ser: 1.08 mg/dL (ref 0.76–1.27)
Globulin, Total: 2.3 g/dL (ref 1.5–4.5)
Glucose: 99 mg/dL (ref 70–99)
Potassium: 4.5 mmol/L (ref 3.5–5.2)
Sodium: 137 mmol/L (ref 134–144)
Total Protein: 6.7 g/dL (ref 6.0–8.5)
eGFR: 72 mL/min/{1.73_m2} (ref >59)

## 2022-10-27 LAB — CBC WITH DIFFERENTIAL/PLATELET
Basophils Absolute: 0 10*3/uL (ref 0.0–0.2)
Basos: 0 %
EOS (ABSOLUTE): 0.6 10*3/uL — ABNORMAL HIGH (ref 0.0–0.4)
Eos: 8 %
Hematocrit: 49.1 % (ref 37.5–51.0)
Hemoglobin: 16.2 g/dL (ref 13.0–17.7)
Immature Grans (Abs): 0 10*3/uL (ref 0.0–0.1)
Immature Granulocytes: 0 %
Lymphocytes Absolute: 1.6 10*3/uL (ref 0.7–3.1)
Lymphs: 24 %
MCH: 32 pg (ref 26.6–33.0)
MCHC: 33 g/dL (ref 31.5–35.7)
MCV: 97 fL (ref 79–97)
Monocytes Absolute: 0.5 10*3/uL (ref 0.1–0.9)
Monocytes: 8 %
Neutrophils Absolute: 4.1 10*3/uL (ref 1.4–7.0)
Neutrophils: 60 %
Platelets: 211 10*3/uL (ref 150–450)
RBC: 5.06 x10E6/uL (ref 4.14–5.80)
RDW: 12.6 % (ref 11.6–15.4)
WBC: 6.9 10*3/uL (ref 3.4–10.8)

## 2022-10-27 LAB — TSH: TSH: 3.24 u[IU]/mL (ref 0.450–4.500)

## 2022-10-27 LAB — HGB A1C W/O EAG: Hgb A1c MFr Bld: 6.2 % — ABNORMAL HIGH (ref 4.8–5.6)

## 2022-10-27 LAB — PSA: Prostate Specific Ag, Serum: 1.7 ng/mL (ref 0.0–4.0)

## 2022-10-27 NOTE — Assessment & Plan Note (Signed)
Rechecking labs today. Await results. Treat as needed.  °

## 2022-10-27 NOTE — Assessment & Plan Note (Signed)
Will keep BP and cholesterol under good control. Continue to monitor. Call with any concerns.

## 2022-10-27 NOTE — Assessment & Plan Note (Signed)
Under good control on current regimen. Continue current regimen. Continue to monitor. Call with any concerns. Refills given. Labs drawn today.

## 2022-10-27 NOTE — Assessment & Plan Note (Signed)
Under good control on current regimen. Continue current regimen. Continue to monitor. Call with any concerns. Refills given. Labs drawn today.   

## 2022-11-03 ENCOUNTER — Ambulatory Visit: Payer: Medicare HMO | Admitting: Physician Assistant

## 2022-11-03 ENCOUNTER — Ambulatory Visit: Payer: Self-pay

## 2022-11-03 ENCOUNTER — Ambulatory Visit
Admission: EM | Admit: 2022-11-03 | Discharge: 2022-11-03 | Disposition: A | Discharge status: Home / Self Care | Payer: Medicare HMO | Attending: Internal Medicine | Admitting: Internal Medicine

## 2022-11-03 DIAGNOSIS — S61551A Open bite of right wrist, initial encounter: Secondary | ICD-10-CM

## 2022-11-03 DIAGNOSIS — S61531A Puncture wound without foreign body of right wrist, initial encounter: Secondary | ICD-10-CM

## 2022-11-03 DIAGNOSIS — W5501XA Bitten by cat, initial encounter: Secondary | ICD-10-CM

## 2022-11-03 MED ORDER — AMOXICILLIN-POT CLAVULANATE 875-125 MG PO TABS
1.0000 | ORAL_TABLET | Freq: Two times a day (BID) | ORAL | 0 refills | Status: AC
Start: 2022-11-03 — End: ?

## 2022-11-03 NOTE — Telephone Encounter (Signed)
Pt's wife Misty Stanley calling back stating that pt's R wrist and hand are more swollen and painful now and she doesn't feel he could wait until this evening to be seen. Advised that he can go to an UC d/t no sooner appts available. Misty Stanley states that they will do walk in appt at [facility]. Appt for [phone] canceled.

## 2022-11-03 NOTE — Discharge Instructions (Signed)
Start Augmentin twice daily for 7 days.  Keep clean and dry.  Please monitor for any signs of infection which include limited to fevers, chills, worsening redness or swelling.  Follow-up with PCP in 2 days for recheck.  I hope you feel better soon!

## 2022-11-03 NOTE — Telephone Encounter (Signed)
  Chief Complaint: Cat bite right wrist Symptoms: Puncture, swollen  Frequency: last night Pertinent Negatives: Patient denies fever Disposition: [] ED /[] Urgent Care (no appt availability in office) / [x] Appointment(In office/virtual)/ []  [facility]/ [] Home Care/ [] Refused Recommended Disposition /[] [facility]/ []  Follow-up with PCP Additional Notes: Pt's cat bit him last night. Area around wound is swollen.   Reason for Disposition  [1] Puncture wound (hole through the skin) AND [2] from a cat bite (or deep claw puncture wound)  Answer Assessment - Initial Assessment Questions 1. ANIMAL: "What type of animal caused the bite?" "Is the injury from a bite or a claw?" If the animal is a dog or a cat, ask: "Was it a pet or a stray?" "Was it acting ill or behaving strangely?"     cat 2. LOCATION: "Where is the bite located?"      Wrist right 3. SIZE: "How big is the bite?" "What does it look like?"      1 puncture wound 4. ONSET: "When did the bite happen?" (Minutes or hours ago)      Last night 5. CIRCUMSTANCES: "Tell me how this happened."      His cat  6. TETANUS: "When was your last tetanus booster?"     unsure 7. RABIES VACCINE: For dog or cat bites, ask: "Do you know if the pet is vaccinated against rabies?"  (e.g., yes, no, overdue for rabies shot, unknown)     Pet is vaccinated  Protocols used: Animal Bite-A-AH

## 2022-11-03 NOTE — ED Triage Notes (Addendum)
Pt presents with a cat bite to the rt wrist. States he was bitten yesterday around 8:30 last night. Pt states it was his own cat and the cat is up to date on vaccines. C/o swelling and pain. Pt does not remember the last time he has a tetanus shot.

## 2022-11-03 NOTE — ED Provider Notes (Signed)
[facility]    CSN: [id number] Arrival date & time: 11/03/22  1110      History   Chief Complaint Chief Complaint  Patient presents with   Animal Bite    HPI Wayne Robles is a 77 y.o. male presents for cat bite.  Patient reports last night he was bitten by his own cat on his right wrist.  He cleaned the wound and applied Neosporin.  States the cat is up-to-date on its rabies vaccine.  Patient is up-to-date on his tetanus from 2022.  He endorses some redness and swelling but denies drainage, fevers or chills.  No other concerns at this time.   Animal Bite   Past Medical History:  Diagnosis Date   Depression    History of colonic polyps    Hyperlipidemia    Hyperthyroidism    Vitamin D deficiency     Patient Active Problem List   Diagnosis Date Noted   Aortic atherosclerosis (HCC) 10/26/2022   Asthma 02/25/2022   IFG (impaired fasting glucose) 08/11/2020   Depression 01/19/2013   Hyperlipidemia 01/19/2013    Past Surgical History:  Procedure Laterality Date   APPENDECTOMY     tonsillectomy         Home Medications    Prior to Admission medications   Medication Sig Start Date End Date Taking? Authorizing Provider  amoxicillin-clavulanate (AUGMENTIN) 875-125 MG tablet Take 1 tablet by mouth every 12 (twelve) hours. 11/03/22  Yes Radford Pax, NP  albuterol (VENTOLIN HFA) 108 (90 Base) MCG/ACT inhaler Inhale 1-2 puffs into the lungs every 6 (six) hours as needed for wheezing or shortness of breath. 10/26/22   Johnson, Megan P, DO  budesonide-formoterol (SYMBICORT) 160-4.5 MCG/ACT inhaler Inhale 2 puffs into the lungs 2 (two) times daily. 10/26/22   Johnson, Megan P, DO  buPROPion (WELLBUTRIN XL) 300 MG 24 hr tablet TAKE 1 TABLET(300 MG) BY MOUTH DAILY 10/26/22   Johnson, Megan P, DO  ezetimibe (ZETIA) 10 MG tablet Take 1 tablet (10 mg total) by mouth daily. 10/26/22   Johnson, Megan P, DO  meloxicam (MOBIC) 15 MG tablet Take 1 tablet (15 mg total) by  mouth daily. 10/26/22   Olevia Perches P, DO  Multiple Vitamin (MULTIVITAMIN) tablet Take 1 tablet by mouth daily.    [provider]  simvastatin (ZOCOR) 40 MG tablet Take 1 tablet (40 mg total) by mouth daily. 10/26/22   Johnson, Megan P, DO  traZODone (DESYREL) 50 MG tablet Take 1 tablet (50 mg total) by mouth at bedtime as needed for sleep. 10/26/22   Dorcas Carrow, DO    Family History Family History  Problem Relation Age of Onset   Cancer Mother        Ovarian   Diabetes Father    Cancer Father        Prostate   Anxiety disorder Daughter     Social History Social History   Tobacco Use   Smoking status: Never   Smokeless tobacco: Never  Vaping Use   Vaping status: Never Used  Substance Use Topics   Alcohol use: No   Drug use: No     Allergies   Patient has no known allergies.   Review of Systems Review of Systems  Skin:        Cat bite right wrist      Physical Exam Triage Vital Signs ED Triage Vitals  Encounter Vitals Group     BP 11/03/22 1141 130/83  Systolic BP Percentile --      Diastolic BP Percentile --      Pulse Rate 11/03/22 1141 67     Resp 11/03/22 1141 18     Temp 11/03/22 1141 (!) 97.5 F (36.4 C)     Temp Source 11/03/22 1141 Oral     SpO2 11/03/22 1141 98 %     Weight --      Height --      Head Circumference --      Peak Flow --      Pain Score 11/03/22 1140 7     Pain Loc --      Pain Education --      Exclude from Growth Chart --    No data found.  Updated Vital Signs BP 130/83 (BP Location: Left Arm)   Pulse 67   Temp (!) 97.5 F (36.4 C) (Oral)   Resp 18   SpO2 98%   Visual Acuity Right Eye Distance:   Left Eye Distance:   Bilateral Distance:    Right Eye Near:   Left Eye Near:    Bilateral Near:     Physical Exam Vitals and nursing note reviewed.  Constitutional:      General: He is not in acute distress.    Appearance: Normal appearance. He is not ill-appearing.  HENT:     Head:  Normocephalic and atraumatic.  Eyes:     Pupils: Pupils are equal, round, and reactive to light.  Cardiovascular:     Rate and Rhythm: Normal rate.  Pulmonary:     Effort: Pulmonary effort is normal.  Skin:    General: Skin is warm and dry.          Comments: There are scattered superficial scratches with 1 puncture wound to the dorsum of the right wrist.  Mild erythema/warmth with minimal swelling.  No induration or fluctuance.  Radial pulse +2.  Neurological:     General: No focal deficit present.     Mental Status: He is alert and oriented to person, place, and time.  Psychiatric:        Mood and Affect: Mood normal.        Behavior: Behavior normal.      UC Treatments / Results  Labs (all labs ordered are listed, but only abnormal results are displayed) Labs Reviewed - No data to display  EKG   Radiology No results found.  Procedures Procedures (including critical care time)  Medications Ordered in UC Medications - No data to display  Initial Impression / Assessment and Plan / UC Course  I have reviewed the triage vital signs and the nursing notes.  Pertinent labs & imaging results that were available during my care of the patient were reviewed by me and considered in my medical decision making (see chart for details).     Reviewed exam and symptoms with patient.  No red flags.  Start Augmentin.  Discussed wound care.  Patient is up-to-date on tetanus and cat is up-to-date on rabies.  Advise follow-up with PCP in 2 days for recheck.  ER precautions reviewed and patient verbalized understanding. Final Clinical Impressions(s) / UC Diagnoses   Final diagnoses:  Cat bite of right wrist, initial encounter  Puncture wound of right wrist, initial encounter     Discharge Instructions      Start Augmentin twice daily for 7 days.  Keep clean and dry.  Please monitor for any signs of infection which include limited to fevers,  chills, worsening redness or swelling.   Follow-up with PCP in 2 days for recheck.  I hope you feel better soon!   ED Prescriptions     Medication Sig Dispense Auth. Provider   amoxicillin-clavulanate (AUGMENTIN) 875-125 MG tablet Take 1 tablet by mouth every 12 (twelve) hours. 14 tablet Radford Pax, NP      PDMP not reviewed this encounter.   Radford Pax, NP 11/03/22 1213

## 2022-11-04 ENCOUNTER — Encounter (HOSPITAL_BASED_OUTPATIENT_CLINIC_OR_DEPARTMENT_OTHER): Payer: Self-pay

## 2022-11-04 ENCOUNTER — Other Ambulatory Visit: Payer: Self-pay

## 2022-11-04 ENCOUNTER — Inpatient Hospital Stay (HOSPITAL_BASED_OUTPATIENT_CLINIC_OR_DEPARTMENT_OTHER)
Admission: EM | Admit: 2022-11-04 | Discharge: 2022-11-07 | DRG: 603 | Disposition: A | Discharge status: Home / Self Care | Payer: Medicare HMO | Attending: Internal Medicine | Admitting: Internal Medicine

## 2022-11-04 DIAGNOSIS — Z79899 Other long term (current) drug therapy: Secondary | ICD-10-CM | POA: Diagnosis not present

## 2022-11-04 DIAGNOSIS — E785 Hyperlipidemia, unspecified: Secondary | ICD-10-CM | POA: Diagnosis present

## 2022-11-04 DIAGNOSIS — L0390 Cellulitis, unspecified: Secondary | ICD-10-CM

## 2022-11-04 DIAGNOSIS — W5501XA Bitten by cat, initial encounter: Principal | ICD-10-CM

## 2022-11-04 DIAGNOSIS — F419 Anxiety disorder, unspecified: Secondary | ICD-10-CM | POA: Diagnosis not present

## 2022-11-04 DIAGNOSIS — Z791 Long term (current) use of non-steroidal anti-inflammatories (NSAID): Secondary | ICD-10-CM | POA: Diagnosis not present

## 2022-11-04 DIAGNOSIS — E7800 Pure hypercholesterolemia, unspecified: Secondary | ICD-10-CM | POA: Diagnosis present

## 2022-11-04 DIAGNOSIS — E782 Mixed hyperlipidemia: Secondary | ICD-10-CM | POA: Diagnosis not present

## 2022-11-04 DIAGNOSIS — F32A Depression, unspecified: Secondary | ICD-10-CM | POA: Diagnosis not present

## 2022-11-04 DIAGNOSIS — Z818 Family history of other mental and behavioral disorders: Secondary | ICD-10-CM | POA: Diagnosis not present

## 2022-11-04 DIAGNOSIS — S61451A Open bite of right hand, initial encounter: Secondary | ICD-10-CM | POA: Diagnosis not present

## 2022-11-04 DIAGNOSIS — Z833 Family history of diabetes mellitus: Secondary | ICD-10-CM

## 2022-11-04 DIAGNOSIS — R7303 Prediabetes: Secondary | ICD-10-CM | POA: Diagnosis present

## 2022-11-04 DIAGNOSIS — R112 Nausea with vomiting, unspecified: Secondary | ICD-10-CM

## 2022-11-04 DIAGNOSIS — J45909 Unspecified asthma, uncomplicated: Secondary | ICD-10-CM | POA: Diagnosis present

## 2022-11-04 DIAGNOSIS — F3341 Major depressive disorder, recurrent, in partial remission: Secondary | ICD-10-CM | POA: Diagnosis not present

## 2022-11-04 DIAGNOSIS — Z7951 Long term (current) use of inhaled steroids: Secondary | ICD-10-CM | POA: Diagnosis not present

## 2022-11-04 DIAGNOSIS — E039 Hypothyroidism, unspecified: Secondary | ICD-10-CM | POA: Diagnosis not present

## 2022-11-04 DIAGNOSIS — M25431 Effusion, right wrist: Secondary | ICD-10-CM | POA: Diagnosis not present

## 2022-11-04 DIAGNOSIS — L03113 Cellulitis of right upper limb: Principal | ICD-10-CM | POA: Diagnosis present

## 2022-11-04 DIAGNOSIS — Z8042 Family history of malignant neoplasm of prostate: Secondary | ICD-10-CM

## 2022-11-04 LAB — CBC WITH DIFFERENTIAL/PLATELET
Abs Immature Granulocytes: 0.04 10*3/uL (ref 0.00–0.07)
Basophils Absolute: 0 10*3/uL (ref 0.0–0.1)
Basophils Relative: 0 %
Eosinophils Absolute: 0.2 10*3/uL (ref 0.0–0.5)
Eosinophils Relative: 1 %
HCT: 47.2 % (ref 39.0–52.0)
Hemoglobin: 16.1 g/dL (ref 13.0–17.0)
Immature Granulocytes: 0 %
Lymphocytes Relative: 9 %
Lymphs Abs: 1.1 10*3/uL (ref 0.7–4.0)
MCH: 31.9 pg (ref 26.0–34.0)
MCHC: 34.1 g/dL (ref 30.0–36.0)
MCV: 93.7 fL (ref 80.0–100.0)
Monocytes Absolute: 0.8 10*3/uL (ref 0.1–1.0)
Monocytes Relative: 7 %
Neutro Abs: 9.9 10*3/uL — ABNORMAL HIGH (ref 1.7–7.7)
Neutrophils Relative %: 83 %
Platelets: 173 10*3/uL (ref 150–400)
RBC: 5.04 MIL/uL (ref 4.22–5.81)
RDW: 12.8 % (ref 11.5–15.5)
WBC: 12 10*3/uL — ABNORMAL HIGH (ref 4.0–10.5)
nRBC: 0 % (ref 0.0–0.2)

## 2022-11-04 LAB — LIPASE, BLOOD: Lipase: 10 U/L — ABNORMAL LOW (ref 11–51)

## 2022-11-04 LAB — COMPREHENSIVE METABOLIC PANEL WITH GFR
ALT: 22 U/L (ref 0–44)
AST: 21 U/L (ref 15–41)
Albumin: 4.2 g/dL (ref 3.5–5.0)
Alkaline Phosphatase: 86 U/L (ref 38–126)
Anion gap: 9 (ref 5–15)
BUN: 20 mg/dL (ref 8–23)
CO2: 24 mmol/L (ref 22–32)
Calcium: 9.2 mg/dL (ref 8.9–10.3)
Chloride: 102 mmol/L (ref 98–111)
Creatinine, Ser: 0.8 mg/dL (ref 0.61–1.24)
GFR, Estimated: >60 mL/min
Glucose, Bld: 110 mg/dL — ABNORMAL HIGH (ref 70–99)
Potassium: 4.1 mmol/L (ref 3.5–5.1)
Sodium: 135 mmol/L (ref 135–145)
Total Bilirubin: 1.8 mg/dL — ABNORMAL HIGH (ref 0.3–1.2)
Total Protein: 7 g/dL (ref 6.5–8.1)

## 2022-11-04 MED ORDER — SIMVASTATIN 40 MG PO TABS
40.0000 mg | ORAL_TABLET | Freq: Every day | ORAL | Status: DC
  Administered 2022-11-04 – 2022-11-06 (×3): 40 mg via ORAL
  Filled 2022-11-04 (×3): qty 1

## 2022-11-04 MED ORDER — MORPHINE SULFATE (PF) 2 MG/ML IV SOLN
2.0000 mg | INTRAVENOUS | Status: DC | PRN
  Administered 2022-11-04: 2 mg via INTRAVENOUS
  Filled 2022-11-04: qty 1

## 2022-11-04 MED ORDER — ACETAMINOPHEN 650 MG RE SUPP: 650.0000 mg | Freq: Four times a day (QID) | RECTAL | Status: DC | PRN

## 2022-11-04 MED ORDER — MOMETASONE FURO-FORMOTEROL FUM 200-5 MCG/ACT IN AERO
2.0000 | INHALATION_SPRAY | Freq: Two times a day (BID) | RESPIRATORY_TRACT | Status: DC
  Filled 2022-11-04: qty 8.8

## 2022-11-04 MED ORDER — ONDANSETRON HCL 4 MG PO TABS: 4.0000 mg | ORAL_TABLET | Freq: Four times a day (QID) | ORAL | Status: DC | PRN

## 2022-11-04 MED ORDER — SODIUM CHLORIDE 0.9 % IV BOLUS
1000.0000 mL | Freq: Once | INTRAVENOUS | Status: AC
  Administered 2022-11-04: 1000 mL via INTRAVENOUS

## 2022-11-04 MED ORDER — SODIUM CHLORIDE 0.9 % IV SOLN
3.0000 g | Freq: Once | INTRAVENOUS | Status: AC
  Administered 2022-11-04: 3 g via INTRAVENOUS

## 2022-11-04 MED ORDER — ALBUTEROL SULFATE (2.5 MG/3ML) 0.083% IN NEBU: 2.5000 mg | INHALATION_SOLUTION | RESPIRATORY_TRACT | Status: DC | PRN

## 2022-11-04 MED ORDER — OXYCODONE HCL 5 MG PO TABS
5.0000 mg | ORAL_TABLET | Freq: Once | ORAL | Status: AC
  Administered 2022-11-04: 5 mg via ORAL
  Filled 2022-11-04: qty 1

## 2022-11-04 MED ORDER — BUPROPION HCL ER (XL) 300 MG PO TB24
300.0000 mg | ORAL_TABLET | Freq: Every day | ORAL | Status: DC
  Administered 2022-11-05 – 2022-11-07 (×3): 300 mg via ORAL
  Filled 2022-11-04 (×3): qty 1

## 2022-11-04 MED ORDER — ENOXAPARIN SODIUM 40 MG/0.4ML IJ SOSY
40.0000 mg | PREFILLED_SYRINGE | INTRAMUSCULAR | Status: DC
  Administered 2022-11-04 – 2022-11-06 (×3): 40 mg via SUBCUTANEOUS
  Filled 2022-11-04 (×3): qty 0.4

## 2022-11-04 MED ORDER — ONDANSETRON HCL 4 MG/2ML IJ SOLN: 4.0000 mg | Freq: Four times a day (QID) | INTRAMUSCULAR | Status: DC | PRN

## 2022-11-04 MED ORDER — OXYCODONE HCL 5 MG PO TABS: 5.0000 mg | ORAL_TABLET | ORAL | Status: DC | PRN

## 2022-11-04 MED ORDER — TRAZODONE HCL 50 MG PO TABS: 50.0000 mg | ORAL_TABLET | Freq: Every evening | ORAL | Status: DC | PRN

## 2022-11-04 MED ORDER — EZETIMIBE 10 MG PO TABS
10.0000 mg | ORAL_TABLET | Freq: Every day | ORAL | Status: DC
  Administered 2022-11-04 – 2022-11-06 (×3): 10 mg via ORAL
  Filled 2022-11-04 (×3): qty 1

## 2022-11-04 MED ORDER — SODIUM CHLORIDE 0.9 % IV SOLN
3.0000 g | Freq: Four times a day (QID) | INTRAVENOUS | Status: DC
  Administered 2022-11-04 – 2022-11-07 (×12): 3 g via INTRAVENOUS
  Filled 2022-11-04 (×13): qty 8

## 2022-11-04 MED ORDER — ACETAMINOPHEN 325 MG PO TABS: 650.0000 mg | ORAL_TABLET | Freq: Four times a day (QID) | ORAL | Status: DC | PRN

## 2022-11-04 MED ORDER — ONDANSETRON HCL 4 MG/2ML IJ SOLN
4.0000 mg | Freq: Once | INTRAMUSCULAR | Status: AC
  Administered 2022-11-04: 4 mg via INTRAVENOUS
  Filled 2022-11-04: qty 2

## 2022-11-04 NOTE — Progress Notes (Signed)
Pharmacy Antibiotic Note  Wayne Robles is a 77 y.o. male admitted on 11/04/2022 with right hand cellulitis after a cat bite. Was prescribed Augmentin yesterday but started having vomiting following his first dose.  Pharmacy has been consulted for Unasyn dosing.  Plan: Unasyn 3 g IV every 6 hours Monitor clinical progress, renal function, LOT      Temp (24hrs), Avg:97.7 F (36.5 C), Min:97.4 F (36.3 C), Max:97.9 F (36.6 C)  Recent Labs  Lab 11/04/22 0954  WBC 12.0*  CREATININE 0.80    Estimated Creatinine Clearance: 82.6 mL/min (by C-G formula based on SCr of 0.8 mg/dL).    No Known Allergies  Antimicrobials this admission: 9/27 Unasyn >>     Thank you for allowing pharmacy to be a part of this patient's care.  Lynden Ang, PharmD, BCPS 11/04/2022 2:56 PM

## 2022-11-04 NOTE — H&P (Signed)
History and Physical  Wayne Robles:[id number] DOB: [birth year] DOA: 11/04/2022  PCP: Dorcas Carrow, DO   Chief Complaint: Right hand swelling, vomiting  HPI: Wayne Robles is a 77 y.o. male with medical history significant for prediabetes, hyper lipidemia, hypothyroidism, depression being admitted to the hospital with right hand cellulitis after a cat bite.  He was bitten by his cat on the evening of 9/25, went to urgent care yesterday was started on Augmentin.  He is up-to-date on his tetanus, cat is up-to-date on rabies.  Yesterday a couple of hours after he took his first dose of Augmentin, he started having vomiting, happened several times through the night.  Denies any fevers, chills, diarrhea, abdominal pain, chest pain.  Came to the ER for evaluation at [facility] this morning due to the vomiting.  He also noticed that overnight the swelling in his arm has worsened, says that yesterday was essentially around his wrist and on the back of his hand, now it is tracking further up into his forearm, and he noticed some streaky redness on his right bicep.  ED Course: He has been afebrile in the ER, blood pressure slightly elevated but hemodynamically stable and saturating well on room air.  Lab work reveals mild leukocytosis, but no other significant abnormalities.  He was started on IV Unasyn and hospitalist contacted for admission.  Review of Systems: Please see HPI for pertinent positives and negatives. A complete 10 system review of systems are otherwise negative.  Past Medical History:  Diagnosis Date   Depression    History of colonic polyps    Hyperlipidemia    Hyperthyroidism    Vitamin D deficiency    Past Surgical History:  Procedure Laterality Date   APPENDECTOMY     tonsillectomy      Social History:  reports that he has never smoked. He has never used smokeless tobacco. He reports that he does not drink alcohol and does not use drugs.   No Known  Allergies  Family History  Problem Relation Age of Onset   Cancer Mother        Ovarian   Diabetes Father    Cancer Father        Prostate   Anxiety disorder Daughter      Prior to Admission medications   Medication Sig Start Date End Date Taking? Authorizing Provider  albuterol (VENTOLIN HFA) 108 (90 Base) MCG/ACT inhaler Inhale 1-2 puffs into the lungs every 6 (six) hours as needed for wheezing or shortness of breath. 10/26/22   Olevia Perches P, DO  amoxicillin-clavulanate (AUGMENTIN) 875-125 MG tablet Take 1 tablet by mouth every 12 (twelve) hours. 11/03/22   Radford Pax, NP  budesonide-formoterol [facility]) 160-4.5 MCG/ACT inhaler Inhale 2 puffs into the lungs 2 (two) times daily. 10/26/22   Johnson, Megan P, DO  buPROPion (WELLBUTRIN XL) 300 MG 24 hr tablet TAKE 1 TABLET(300 MG) BY MOUTH DAILY 10/26/22   Johnson, Megan P, DO  ezetimibe (ZETIA) 10 MG tablet Take 1 tablet (10 mg total) by mouth daily. 10/26/22   Johnson, Megan P, DO  meloxicam (MOBIC) 15 MG tablet Take 1 tablet (15 mg total) by mouth daily. 10/26/22   Olevia Perches P, DO  Multiple Vitamin (MULTIVITAMIN) tablet Take 1 tablet by mouth daily.    [provider]  simvastatin (ZOCOR) 40 MG tablet Take 1 tablet (40 mg total) by mouth daily. 10/26/22   Johnson, Megan P, DO  traZODone (DESYREL) 50 MG tablet Take 1  tablet (50 mg total) by mouth at bedtime as needed for sleep. 10/26/22   Dorcas Carrow, DO    Physical Exam: BP (!) 146/82 (BP Location: Right Arm)   Pulse 63   Temp (!) 97.4 F (36.3 C) (Oral)   Resp 18   SpO2 98%   General:  Alert, oriented, calm, in no acute distress, looks comfortable and nontoxic, his wife and son are at the bedside Eyes: EOMI, clear conjuctivae, white sclerea Neck: supple, no masses, trachea mildline  Cardiovascular: RRR, no murmurs or rubs, no peripheral edema  Respiratory: clear to auscultation bilaterally, no wheezes, no crackles  Abdomen: soft, nontender, nondistended,  normal bowel tones heard  Skin: dry, he has erythema and swelling on the dorsum of the right hand and distal forearm, no tenderness, fingers are intact and mobile, with intact sensorium, no areas of crepitus, fluctuance or tenderness.  Does have some streaking erythema and a lymphangitic pattern into the right bicep Musculoskeletal: no joint effusions, normal range of motion  Psychiatric: appropriate affect, normal speech  Neurologic: extraocular muscles intact, clear speech, moving all extremities with intact sensorium              Labs on Admission:  Basic Metabolic Panel: Recent Labs  Lab 11/04/22 0954  NA 135  K 4.1  CL 102  CO2 24  GLUCOSE 110*  BUN 20  CREATININE 0.80  CALCIUM 9.2   Liver Function Tests: Recent Labs  Lab 11/04/22 0954  AST 21  ALT 22  ALKPHOS 86  BILITOT 1.8*  PROT 7.0  ALBUMIN 4.2   Recent Labs  Lab 11/04/22 0954  LIPASE 10*   No results for input(s): "AMMONIA" in the last 168 hours. CBC: Recent Labs  Lab 11/04/22 0954  WBC 12.0*  NEUTROABS 9.9*  HGB 16.1  HCT 47.2  MCV 93.7  PLT 173   Cardiac Enzymes: No results for input(s): "CKTOTAL", "CKMB", "CKMBINDEX", "TROPONINI" in the last 168 hours.  BNP (last 3 results) No results for input(s): "BNP" in the last 8760 hours.  ProBNP (last 3 results) No results for input(s): "PROBNP" in the last 8760 hours.  CBG: No results for input(s): "GLUCAP" in the last 168 hours.  Radiological Exams on Admission: No results found.  Assessment/Plan Wayne Robles is a 77 y.o. male with medical history significant for prediabetes, hyper lipidemia, hypothyroidism, depression being admitted to the hospital with right hand cellulitis after a cat bite.   Right hand cellulitis after cat bite-currently no evidence of sepsis, exam and appearance not concerning for deeper infection. -Inpatient admission -Continue empiric IV Unasyn -Follow clinically, in case of fevers, worsening exam, worsening  pain, development of sepsis would consider imaging to rule out abscess or other complication -Pain and nausea medication as needed, oxycodone did not control his pain and made nausea worse so we will use IV morphine for now  Leukocytosis-likely due to cellulitis, versus reactive from recent vomiting  Hyperlipidemia-Zocor and Zetia  Depression-Wellbutrin  DVT prophylaxis: Lovenox     Code Status: Full Code  Consults called: None  Admission status: The appropriate patient status for this patient is INPATIENT. Inpatient status is judged to be reasonable and necessary in order to provide the required intensity of service to ensure the patient's safety. The patient's presenting symptoms, physical exam findings, and initial radiographic and laboratory data in the context of their chronic comorbidities is felt to place them at high risk for further clinical deterioration. Furthermore, it is not anticipated that the  patient will be medically stable for discharge from the hospital within 2 midnights of admission.    I certify that at the point of admission it is my clinical judgment that the patient will require inpatient hospital care spanning beyond 2 midnights from the point of admission due to high intensity of service, high risk for further deterioration and high frequency of surveillance required  Time spent: 48 minutes  Jinger Middlesworth Sharlette Dense MD [facility] Pager [phone]  If 7PM-7AM, please contact night-coverage [email or web address] Password [email or web address]  11/04/2022, 1:34 PM

## 2022-11-04 NOTE — Progress Notes (Signed)
   11/04/22 1454  TOC Brief Assessment  Insurance and Status Reviewed  Patient has primary care physician Yes (Johnson, Megan P, DO)  Home environment has been reviewed Yes (Home with spouse)  Prior level of function: Independent  Prior/Current Home Services No current home services  Social Determinants of Health Reivew SDOH reviewed no interventions necessary  Readmission risk has been reviewed Yes  Transition of care needs no transition of care needs at this time

## 2022-11-04 NOTE — ED Triage Notes (Signed)
Pt arrived POV from home, caox4, ambulatory, NAD c/o R arm pain and swelling that has been increasing since being bit by his cat Wednesday evening. Pt reports vomiting throughout the evening. Pt was seen at [facility] yesterday and prescribed Augmentin.

## 2022-11-04 NOTE — ED Provider Notes (Signed)
[place] EMERGENCY DEPARTMENT AT [facility] Provider Note   CSN: [id number] Arrival date & time: 11/04/22  4098     History  Chief Complaint  Patient presents with   Animal Bite    Wayne Robles is a 77 y.o. male.  Patient here with nausea and vomiting.  He can hold down his Augmentin antibiotic that he was started on yesterday for cat bite to his right forearm area.  Redness and swelling in this area with little bit of tracking up the forearm.  Denies any fevers or chills.  He is throwing up maybe 9 or 10 times today.  He has not been able to get his second dose antibiotic down.  He denies any other major medical problems including diabetes.  History of high cholesterol.  Denies any weakness numbness tingling.  He feels fine but denies any abdominal pain diarrhea or suspicious food intake.  Shot was updated.  He started on antibiotic at urgent care yesterday.  Cat was his.  Cat shots are up-to-date.  The history is provided by the patient.       Home Medications Prior to Admission medications   Medication Sig Start Date End Date Taking? Authorizing Provider  albuterol (VENTOLIN HFA) 108 (90 Base) MCG/ACT inhaler Inhale 1-2 puffs into the lungs every 6 (six) hours as needed for wheezing or shortness of breath. 10/26/22   Olevia Perches P, DO  amoxicillin-clavulanate (AUGMENTIN) 875-125 MG tablet Take 1 tablet by mouth every 12 (twelve) hours. 11/03/22   Radford Pax, NP  budesonide-formoterol [facility]) 160-4.5 MCG/ACT inhaler Inhale 2 puffs into the lungs 2 (two) times daily. 10/26/22   Johnson, Megan P, DO  buPROPion (WELLBUTRIN XL) 300 MG 24 hr tablet TAKE 1 TABLET(300 MG) BY MOUTH DAILY 10/26/22   Johnson, Megan P, DO  ezetimibe (ZETIA) 10 MG tablet Take 1 tablet (10 mg total) by mouth daily. 10/26/22   Johnson, Megan P, DO  meloxicam (MOBIC) 15 MG tablet Take 1 tablet (15 mg total) by mouth daily. 10/26/22   Olevia Perches P, DO  Multiple Vitamin (MULTIVITAMIN) tablet  Take 1 tablet by mouth daily.    [provider]  simvastatin (ZOCOR) 40 MG tablet Take 1 tablet (40 mg total) by mouth daily. 10/26/22   Johnson, Megan P, DO  traZODone (DESYREL) 50 MG tablet Take 1 tablet (50 mg total) by mouth at bedtime as needed for sleep. 10/26/22   Dorcas Carrow, DO      Allergies    Patient has no known allergies.    Review of Systems   Review of Systems  Physical Exam Updated Vital Signs  ED Triage Vitals  Encounter Vitals Group     BP 11/04/22 0929 (!) 154/85     Systolic BP Percentile --      Diastolic BP Percentile --      Pulse Rate 11/04/22 0929 82     Resp 11/04/22 0929 18     Temp 11/04/22 0929 97.9 F (36.6 C)     Temp Source 11/04/22 0929 Oral     SpO2 11/04/22 0929 97 %     Weight --      Height --      Head Circumference --      Peak Flow --      Pain Score 11/04/22 0930 7     Pain Loc --      Pain Education --      Exclude from Growth Chart --  Physical Exam Vitals and nursing note reviewed.  Constitutional:      General: He is not in acute distress.    Appearance: He is well-developed. He is not ill-appearing.  HENT:     Head: Normocephalic and atraumatic.     Nose: Nose normal.     Mouth/Throat:     Mouth: Mucous membranes are moist.  Eyes:     Extraocular Movements: Extraocular movements intact.     Conjunctiva/sclera: Conjunctivae normal.     Pupils: Pupils are equal, round, and reactive to light.  Cardiovascular:     Rate and Rhythm: Normal rate and regular rhythm.     Pulses: Normal pulses.     Heart sounds: Normal heart sounds. No murmur heard. Pulmonary:     Effort: Pulmonary effort is normal. No respiratory distress.     Breath sounds: Normal breath sounds.  Abdominal:     Palpations: Abdomen is soft.     Tenderness: There is no abdominal tenderness.  Musculoskeletal:        General: No swelling.     Cervical back: Normal range of motion and neck supple.  Skin:    General: Skin is warm and dry.      Capillary Refill: Capillary refill takes less than 2 seconds.     Findings: Erythema present.     Comments: Redness and swelling to the right forearm to the dorsum of the hand with little bit of tracking of the right forearm  Neurological:     General: No focal deficit present.     Mental Status: He is alert and oriented to person, place, and time.     Cranial Nerves: No cranial nerve deficit.     Sensory: No sensory deficit.     Motor: No weakness.     Coordination: Coordination normal.  Psychiatric:        Mood and Affect: Mood normal.     ED Results / Procedures / Treatments   Labs (all labs ordered are listed, but only abnormal results are displayed) Labs Reviewed  CBC WITH DIFFERENTIAL/PLATELET - Abnormal; Notable for the following components:      Result Value   WBC 12.0 (*)    Neutro Abs 9.9 (*)    All other components within normal limits  COMPREHENSIVE METABOLIC PANEL - Abnormal; Notable for the following components:   Glucose, Bld 110 (*)    Total Bilirubin 1.8 (*)    All other components within normal limits  LIPASE, BLOOD - Abnormal; Notable for the following components:   Lipase 10 (*)    All other components within normal limits    EKG None  Radiology No results found.  Procedures Procedures    Medications Ordered in ED Medications  ondansetron (ZOFRAN) injection 4 mg (4 mg Intravenous Given 11/04/22 0958)  sodium chloride 0.9 % bolus 1,000 mL (1,000 mLs Intravenous New Bag/Given 11/04/22 1000)  Ampicillin-Sulbactam (UNASYN) 3 g in sodium chloride 0.9 % 100 mL IVPB (3 g Intravenous New Bag/Given 11/04/22 1001)  oxyCODONE (Oxy IR/ROXICODONE) immediate release tablet 5 mg (5 mg Oral Given 11/04/22 1015)    ED Course/ Medical Decision Making/ A&P                                 Medical Decision Making Amount and/or Complexity of Data Reviewed Labs: ordered.  Risk Prescription drug management. Decision regarding hospitalization.   Wayne Robles is here with  nausea and vomiting.  Normal vitals.  No fever.  No morbidities of high cholesterol.  Patient bit by his cat and had tetanus shot updated and started on Augmentin yesterday.  However he had significant nausea vomiting shortly after his first dose of antibiotic yesterday.  He has been throwing up and dry heaving throughout the night.  May be 9-10 times.  He has been able to tolerate his second dose antibiotic.  He is got redness and swelling to the right forearm area around the Bite.  Fairly localized with there is a little bit of streaking out of the forearm.  Does involve some of the dorsum of the hand now as well.  No concern for abscess.  There is no crepitus.  Overall he is not having any other diarrhea or abdominal pain or chest pain or shortness of breath.  I do suspect this could be some mild intolerance to the antibiotic or may be some sort of viral process that is also going on.  Ultimately will give him a dose of IV Unasyn after talking with pharmacy.  Will check basic labs.  Anticipate likely obs admission to ensure that he can tolerate p.o. antibiotics and make sure that this infection does not get worse.  Awaiting lab work and reevaluation.  Lab works overall unremarkable except for mild leukocytosis.  No significant anemia or electrolyte abnormality or kidney injury otherwise.  Overall I think it be worth admitting the patient for observation to make sure he can tolerate antibiotics and he gets good clinical response to the IV antibiotics.  Admitted to hospitalist.  This chart was dictated using voice recognition software.  Despite best efforts to proofread,  errors can occur which can change the documentation meaning.         Final Clinical Impression(s) / ED Diagnoses Final diagnoses:  Cat bite, initial encounter  Cellulitis, unspecified cellulitis site  Nausea and vomiting, unspecified vomiting type    Rx / DC Orders ED Discharge Orders     None          Virgina Norfolk, DO 11/04/22 1042

## 2022-11-04 NOTE — ED Notes (Signed)
Wayne Robles with cl called for transport 

## 2022-11-04 NOTE — Progress Notes (Signed)
No suicide risk - unable to reset assessment

## 2022-11-04 NOTE — ED Notes (Signed)
Called report to [facility] at [facility] for room 1615. Also spoke with [facility] with [facility]. ETA approx 15 minutes.

## 2022-11-05 DIAGNOSIS — F3341 Major depressive disorder, recurrent, in partial remission: Secondary | ICD-10-CM | POA: Diagnosis not present

## 2022-11-05 DIAGNOSIS — E782 Mixed hyperlipidemia: Secondary | ICD-10-CM

## 2022-11-05 DIAGNOSIS — L03113 Cellulitis of right upper limb: Secondary | ICD-10-CM | POA: Diagnosis not present

## 2022-11-05 LAB — CBC
HCT: 46.1 % (ref 39.0–52.0)
Hemoglobin: 15.1 g/dL (ref 13.0–17.0)
MCH: 31.8 pg (ref 26.0–34.0)
MCHC: 32.8 g/dL (ref 30.0–36.0)
MCV: 97.1 fL (ref 80.0–100.0)
Platelets: 175 10*3/uL (ref 150–400)
RBC: 4.75 MIL/uL (ref 4.22–5.81)
RDW: 13 % (ref 11.5–15.5)
WBC: 10.7 10*3/uL — ABNORMAL HIGH (ref 4.0–10.5)
nRBC: 0 % (ref 0.0–0.2)

## 2022-11-05 LAB — BASIC METABOLIC PANEL
Anion gap: 11 (ref 5–15)
BUN: 18 mg/dL (ref 8–23)
CO2: 21 mmol/L — ABNORMAL LOW (ref 22–32)
Calcium: 8.6 mg/dL — ABNORMAL LOW (ref 8.9–10.3)
Chloride: 104 mmol/L (ref 98–111)
Creatinine, Ser: 0.74 mg/dL (ref 0.61–1.24)
GFR, Estimated: >60 mL/min (ref >60)
Glucose, Bld: 113 mg/dL — ABNORMAL HIGH (ref 70–99)
Potassium: 3.9 mmol/L (ref 3.5–5.1)
Sodium: 136 mmol/L (ref 135–145)

## 2022-11-05 NOTE — Assessment & Plan Note (Addendum)
Likely secondary to cat bite.  Improving clinical symptoms.  Leukocytosis improving. -Continue IV Unasyn for another day. -Continue with supportive care.

## 2022-11-05 NOTE — Hospital Course (Addendum)
Taken from H&P.   Wayne Robles is a 77 y.o. male with medical history significant for prediabetes, hyper lipidemia, hypothyroidism, depression being admitted to the hospital with right hand cellulitis after a cat bite.  He was bitten by his cat on the evening of 9/25, went to urgent care yesterday was started on Augmentin.  He is up-to-date on his tetanus, cat is up-to-date on rabies.  Yesterday a couple of hours after he took his first dose of Augmentin, he started having vomiting, happened several times through the night.  Denies any fevers, chills, diarrhea, abdominal pain, chest pain.  Came to the ER for evaluation at [facility] this morning due to the vomiting.  Due to interim worsening of cellulitis he was admitted for IV antibiotic.  On presenting to ER he was afebrile, mild leukocytosis and no other significant lab abnormalities. He was started on Unasyn.  9/28: Vital stable, improving leukocytosis.  Continuing IV antibiotics for another day.

## 2022-11-05 NOTE — Progress Notes (Signed)
  Progress Note   Patient: Wayne Robles UJW:[id number] DOB: 1945/03/30 DOA: 11/04/2022     1 DOS: the patient was seen and examined on 11/05/2022   Brief hospital course: Taken from H&P.   Wayne Robles is a 77 y.o. male with medical history significant for prediabetes, hyper lipidemia, hypothyroidism, depression being admitted to the hospital with right hand cellulitis after a cat bite.  He was bitten by his cat on the evening of 9/25, went to urgent care yesterday was started on Augmentin.  He is up-to-date on his tetanus, cat is up-to-date on rabies.  Yesterday a couple of hours after he took his first dose of Augmentin, he started having vomiting, happened several times through the night.  Denies any fevers, chills, diarrhea, abdominal pain, chest pain.  Came to the ER for evaluation at [facility] this morning due to the vomiting.  Due to interim worsening of cellulitis he was admitted for IV antibiotic.  On presenting to ER he was afebrile, mild leukocytosis and no other significant lab abnormalities. He was started on Unasyn.  9/28: Vital stable, improving leukocytosis.  Continuing IV antibiotics for another day.  Assessment and Plan: * Right arm cellulitis Likely secondary to cat bite.  Improving clinical symptoms.  Leukocytosis improving. -Continue IV Unasyn for another day. -Continue with supportive care.  Depression -Continue home Wellbutrin  Hyperlipidemia -Continue home Zocor and Zetia.   Subjective: Patient was seen and examined today.  Still having right hand and forearm pain but stating that it is little better than before.  Physical Exam: Vitals:   11/04/22 1720 11/04/22 2116 11/05/22 0126 11/05/22 0604  BP: (!) 140/82 139/76 (!) 142/87 (!) 148/79  Pulse: 71 66 65 66  Resp: 18 17 17 17   Temp: 97.7 F (36.5 C) 98.7 F (37.1 C) 98.4 F (36.9 C) 98.1 F (36.7 C)  TempSrc: Oral Oral Oral Oral  SpO2: 95% 93% 96% 96%   General.  Well-developed elderly man,  in no acute distress. Pulmonary.  Lungs clear bilaterally, normal respiratory effort. CV.  Regular rate and rhythm, no JVD, rub or murmur. Abdomen.  Soft, nontender, nondistended, BS positive. CNS.  Alert and oriented .  No focal neurologic deficit. Extremities.  Right hand and forearm edema and erythema. Pulses intact and symmetrical. Psychiatry.  Judgment and insight appears normal.   Data Reviewed: Prior data reviewed  Family Communication: Discussed with patient  Disposition: Status is: Inpatient Remains inpatient appropriate because: Need for 1 more day of IV antibiotic  Planned Discharge Destination: Home  DVT prophylaxis.  Lovenox Time spent: 40 minutes  This record has been created using Conservation officer, historic buildings. Errors have been sought and corrected,but may not always be located. Such creation errors do not reflect on the standard of care.   Author: Arnetha Courser, MD 11/05/2022 12:27 PM  For on call review [email or web address].

## 2022-11-05 NOTE — Assessment & Plan Note (Signed)
-   Continue home Wellbutrin 

## 2022-11-05 NOTE — Progress Notes (Signed)
Mobility Specialist - Progress Note   11/05/22 1128  Mobility  Activity Ambulated independently in hallway  Level of Assistance Independent  Assistive Device None  Distance Ambulated (ft) 500 ft  Activity Response Tolerated well  Mobility Referral Yes  $Mobility charge 1 Mobility  Mobility Specialist Start Time (ACUTE ONLY) 1120  Mobility Specialist Stop Time (ACUTE ONLY) 1127  Mobility Specialist Time Calculation (min) (ACUTE ONLY) 7 min   Pt received in bed and agreeable to mobility. No complaints during session. Pt to bed after session with all needs met.    [facility]

## 2022-11-05 NOTE — Assessment & Plan Note (Signed)
-  Continue home Zocor and Zetia

## 2022-11-06 ENCOUNTER — Inpatient Hospital Stay (HOSPITAL_COMMUNITY): Payer: Medicare HMO

## 2022-11-06 ENCOUNTER — Encounter (HOSPITAL_COMMUNITY): Payer: Self-pay | Admitting: Internal Medicine

## 2022-11-06 DIAGNOSIS — L03113 Cellulitis of right upper limb: Secondary | ICD-10-CM | POA: Diagnosis not present

## 2022-11-06 LAB — CBC WITH DIFFERENTIAL/PLATELET
Abs Immature Granulocytes: 0.04 10*3/uL (ref 0.00–0.07)
Basophils Absolute: 0 10*3/uL (ref 0.0–0.1)
Basophils Relative: 0 %
Eosinophils Absolute: 0.3 10*3/uL (ref 0.0–0.5)
Eosinophils Relative: 3 %
HCT: 46.2 % (ref 39.0–52.0)
Hemoglobin: 15.3 g/dL (ref 13.0–17.0)
Immature Granulocytes: 1 %
Lymphocytes Relative: 13 %
Lymphs Abs: 1 10*3/uL (ref 0.7–4.0)
MCH: 31.8 pg (ref 26.0–34.0)
MCHC: 33.1 g/dL (ref 30.0–36.0)
MCV: 96 fL (ref 80.0–100.0)
Monocytes Absolute: 0.4 10*3/uL (ref 0.1–1.0)
Monocytes Relative: 5 %
Neutro Abs: 6.2 10*3/uL (ref 1.7–7.7)
Neutrophils Relative %: 78 %
Platelets: 171 10*3/uL (ref 150–400)
RBC: 4.81 MIL/uL (ref 4.22–5.81)
RDW: 12.8 % (ref 11.5–15.5)
WBC: 7.9 10*3/uL (ref 4.0–10.5)
nRBC: 0 % (ref 0.0–0.2)

## 2022-11-06 LAB — MAGNESIUM: Magnesium: 2.2 mg/dL (ref 1.7–2.4)

## 2022-11-06 LAB — COMPREHENSIVE METABOLIC PANEL
ALT: 19 U/L (ref 0–44)
AST: 21 U/L (ref 15–41)
Albumin: 3.3 g/dL — ABNORMAL LOW (ref 3.5–5.0)
Alkaline Phosphatase: 79 U/L (ref 38–126)
Anion gap: 9 (ref 5–15)
BUN: 20 mg/dL (ref 8–23)
CO2: 22 mmol/L (ref 22–32)
Calcium: 8.6 mg/dL — ABNORMAL LOW (ref 8.9–10.3)
Chloride: 105 mmol/L (ref 98–111)
Creatinine, Ser: 0.82 mg/dL (ref 0.61–1.24)
GFR, Estimated: >60 mL/min (ref >60)
Glucose, Bld: 165 mg/dL — ABNORMAL HIGH (ref 70–99)
Potassium: 3.4 mmol/L — ABNORMAL LOW (ref 3.5–5.1)
Sodium: 136 mmol/L (ref 135–145)
Total Bilirubin: 1.1 mg/dL (ref 0.3–1.2)
Total Protein: 6.6 g/dL (ref 6.5–8.1)

## 2022-11-06 MED ORDER — SODIUM CHLORIDE 0.9 % IV SOLN: INTRAVENOUS | Status: DC

## 2022-11-06 MED ORDER — IOHEXOL 300 MG/ML  SOLN
100.0000 mL | Freq: Once | INTRAMUSCULAR | Status: AC | PRN
  Administered 2022-11-06: 100 mL via INTRAVENOUS

## 2022-11-06 MED ORDER — SODIUM CHLORIDE (PF) 0.9 % IJ SOLN
INTRAMUSCULAR | Status: AC
  Filled 2022-11-06: qty 50

## 2022-11-06 NOTE — Progress Notes (Signed)
Patient ambulating in the hall, tolerated well.

## 2022-11-06 NOTE — Plan of Care (Signed)
  Problem: Education: Goal: Knowledge of General Education information will improve Description: Including pain rating scale, medication(s)/side effects and non-pharmacologic comfort measures Outcome: Progressing   Problem: Coping: Goal: Level of anxiety will decrease Outcome: Progressing   Problem: Safety: Goal: Ability to remain free from injury will improve Outcome: Progressing   

## 2022-11-06 NOTE — Progress Notes (Signed)
PROGRESS NOTE  Wayne Robles [id number]:[id number] DOB: [birth year] DOA: 11/04/2022 PCP: Dorcas Carrow, DO  HPI/Recap of past 24 hours: Wayne Robles is a 77 y.o. male with medical history significant for prediabetes with hemoglobin A1c 6.2 as of 10/26/2022, hyperlipidemia, depression/anxiety on bupropion admitted after his own cat bite to his right wrist on the evening of 11/02/22.  Initially went to urgent care on 9/26 and was started on Augmentin.  Presented to the ED on 9/27 at [facility] due to vomiting.  Associated with overnight worsening of the swelling of his right wrist and arm, redness, tenderness, and warmth.  Diagnosed with right hand cellulitis after cat bite and transferred to [facility] MedSurg unit for IV antibiotics.  11/06/2022: The patient was seen and examined at his bedside.  Right wrist appears swollen, erythematous, tender, and warm.  Right wrist passive range of motion is limited.  Will obtain a CT right wrist with IV contrast to rule out any complication from cat bite.  Assessment/Plan: Principal Problem:   Right arm cellulitis Active Problems:   Depression   Hyperlipidemia   Right hand cellulitis after cat bite Continue Unasyn 4 times daily Follow peripheral blood cultures x 2 Follow CT right wrist with contrast Gentle IV fluid hydration post IV contrast, NS at 75 cc/h x 2 days  Hyperlipidemia Resume home Zetia and Zocor  Chronic anxiety/depression Continue home bupropion  Asthma No acute issues Continue home bronchodilators  Time: 55 minutes.  Code Status: Full code  Family Communication: None at bedside  Disposition Plan: Likely will discharge once cellulitis has improved in the next 24 to 48 hours, and is cleared by CT right wrist with contrast to rule out complications from cat bite.   Consultants: None.  Procedures: None.  Antimicrobials: Unasyn  DVT prophylaxis: Subcu Lovenox daily  Status is: Inpatient The patient  requires at least 2 midnights for further evaluation and treatment of present condition.    Objective: Vitals:   11/05/22 0604 11/05/22 1322 11/05/22 2052 11/06/22 0545  BP: (!) 148/79 (!) 144/80 127/87 128/80  Pulse: 66 71 66 61  Resp: 17 16 16 14   Temp: 98.1 F (36.7 C) 98.2 F (36.8 C) 98.2 F (36.8 C) 98.2 F (36.8 C)  TempSrc: Oral Oral Oral Oral  SpO2: 96% 98% 97% 96%    Intake/Output Summary (Last 24 hours) at 11/06/2022 1248 Last data filed at 11/06/2022 0800 Gross per 24 hour  Intake 1293.29 ml  Output 450 ml  Net 843.29 ml   There were no vitals filed for this visit.  Exam:  General: 77 y.o. year-old male well developed well nourished in no acute distress.  Alert and oriented x3. Cardiovascular: Regular rate and rhythm with no rubs or gallops.  No thyromegaly or JVD noted.   Respiratory: Clear to auscultation with no wheezes or rales. Good inspiratory effort. Abdomen: Soft nontender nondistended with normal bowel sounds x4 quadrants. Musculoskeletal: No lower extremity edema. 2/4 pulses in all 4 extremities. Skin:  Psychiatry: Mood is appropriate for condition and setting   Data Reviewed: CBC: Recent Labs  Lab 11/04/22 0954 11/05/22 0754  WBC 12.0* 10.7*  NEUTROABS 9.9*  --   HGB 16.1 15.1  HCT 47.2 46.1  MCV 93.7 97.1  PLT 173 175   Basic Metabolic Panel: Recent Labs  Lab 11/04/22 0954 11/05/22 0754  NA 135 136  K 4.1 3.9  CL 102 104  CO2 24 21*  GLUCOSE 110* 113*  BUN 20 18  CREATININE 0.80 0.74  CALCIUM 9.2 8.6*   GFR: Estimated Creatinine Clearance: 82.6 mL/min (by C-G formula based on SCr of 0.74 mg/dL). Liver Function Tests: Recent Labs  Lab 11/04/22 0954  AST 21  ALT 22  ALKPHOS 86  BILITOT 1.8*  PROT 7.0  ALBUMIN 4.2   Recent Labs  Lab 11/04/22 0954  LIPASE 10*   No results for input(s): "AMMONIA" in the last 168 hours. Coagulation Profile: No results for input(s): "INR", "PROTIME" in the last 168 hours. Cardiac  Enzymes: No results for input(s): "CKTOTAL", "CKMB", "CKMBINDEX", "TROPONINI" in the last 168 hours. BNP (last 3 results) No results for input(s): "PROBNP" in the last 8760 hours. HbA1C: No results for input(s): "HGBA1C" in the last 72 hours. CBG: No results for input(s): "GLUCAP" in the last 168 hours. Lipid Profile: No results for input(s): "CHOL", "HDL", "LDLCALC", "TRIG", "CHOLHDL", "LDLDIRECT" in the last 72 hours. Thyroid Function Tests: No results for input(s): "TSH", "T4TOTAL", "FREET4", "T3FREE", "THYROIDAB" in the last 72 hours. Anemia Panel: No results for input(s): "VITAMINB12", "FOLATE", "FERRITIN", "TIBC", "IRON", "RETICCTPCT" in the last 72 hours. Urine analysis:    Component Value Date/Time   APPEARANCEUR Clear 06/09/2021 0849   GLUCOSEU Negative 06/09/2021 0849   BILIRUBINUR Negative 06/09/2021 0849   PROTEINUR Negative 06/09/2021 0849   NITRITE Negative 06/09/2021 0849   LEUKOCYTESUR Negative 06/09/2021 0849   Sepsis Labs: @LABRCNTIP (procalcitonin:4,lacticidven:4)  )No results found for this or any previous visit (from the past 240 hour(s)).    Studies: No results found.  Scheduled Meds:  buPROPion  300 mg Oral Daily   enoxaparin (LOVENOX) injection  40 mg Subcutaneous Q24H   ezetimibe  10 mg Oral Daily   mometasone-formoterol  2 puff Inhalation BID   simvastatin  40 mg Oral Daily    Continuous Infusions:  sodium chloride     ampicillin-sulbactam (UNASYN) IV 3 g (11/06/22 1017)     LOS: 2 days     Darlin Drop, MD [facility] Pager [phone]  If 7PM-7AM, please contact night-coverage [email or web address] Password [email or web address] 11/06/2022, 12:48 PM

## 2022-11-06 NOTE — Plan of Care (Signed)
?  Problem: Education: ?Goal: Knowledge of General Education information will improve ?Description: Including pain rating scale, medication(s)/side effects and non-pharmacologic comfort measures ?Outcome: Progressing ?  ?Problem: Health Behavior/Discharge Planning: ?Goal: Ability to manage health-related needs will improve ?Outcome: Progressing ?  ?Problem: Clinical Measurements: ?Goal: Ability to maintain clinical measurements within normal limits will improve ?Outcome: Progressing ?Goal: Will remain free from infection ?Outcome: Progressing ?Goal: Diagnostic test results will improve ?Outcome: Progressing ?Goal: Cardiovascular complication will be avoided ?Outcome: Progressing ?  ?Problem: Pain Managment: ?Goal: General experience of comfort will improve ?Outcome: Progressing ?  ?Problem: Safety: ?Goal: Ability to remain free from injury will improve ?Outcome: Progressing ?  ?Problem: Skin Integrity: ?Goal: Risk for impaired skin integrity will decrease ?Outcome: Progressing ?  ?

## 2022-11-07 DIAGNOSIS — L03113 Cellulitis of right upper limb: Secondary | ICD-10-CM | POA: Diagnosis not present

## 2022-11-07 LAB — MRSA NEXT GEN BY PCR, NASAL: MRSA by PCR Next Gen: NOT DETECTED

## 2022-11-07 MED ORDER — AMOXICILLIN-POT CLAVULANATE 875-125 MG PO TABS: 1.0000 | ORAL_TABLET | Freq: Two times a day (BID) | ORAL | 0 refills | Status: AC

## 2022-11-07 MED ORDER — AMOXICILLIN-POT CLAVULANATE 875-125 MG PO TABS: 1.0000 | ORAL_TABLET | Freq: Two times a day (BID) | ORAL | Status: DC

## 2022-11-07 NOTE — Discharge Summary (Signed)
Discharge Summary  Wayne Robles WUJ:[id number] DOB: Jun 26, 1945  PCP: Dorcas Carrow, DO  Admit date: 11/04/2022 Discharge date: 11/07/2022  Time spent: 35 minutes.  Recommendations for Outpatient Follow-up:  Follow-up with your primary care provider. Take your medications as prescribed.  Discharge Diagnoses:  Active Hospital Problems   Diagnosis Date Noted   Right arm cellulitis 11/04/2022    Priority: 1.   Depression 01/19/2013    Priority: 2.   Hyperlipidemia 01/19/2013    Priority: 3.    Resolved Hospital Problems  No resolved problems to display.    Discharge Condition: Stable  Diet recommendation: Resume p.o. diet.  Vitals:   11/06/22 2103 11/07/22 0521  BP: 135/83 138/89  Pulse: 74 63  Resp: 18 18  Temp: 98.3 F (36.8 C) 98 F (36.7 C)  SpO2: 96% 96%    History of present illness:   Wayne Robles is a 77 y.o. male with medical history significant for prediabetes with hemoglobin A1c 6.2 as of 10/26/2022, hyperlipidemia, depression/anxiety on bupropion admitted after his own cat bite to his right wrist on the evening of 11/02/22.  Initially went to urgent care on 9/26 and was started on Augmentin.  Presented to the ED on 9/27 at [facility] due to vomiting.  Associated with overnight worsening of the swelling of his right wrist and arm, redness, tenderness, and warmth.  Diagnosed with right hand cellulitis after cat bite and transferred to [facility] MedSurg unit for IV antibiotics.  CT right wrist with IV contrast showed no evidence of complications, no abscess, joint effusion, or evidence of osteomyelitis.   11/07/2022: The patient was seen and examined at his bedside.  There were no acute events overnight.  He endorses minimal pain in his right wrist today.  Cellulitis and range of motion is improving.  Hospital Course:  Principal Problem:   Right arm cellulitis Active Problems:   Depression   Hyperlipidemia  Right hand cellulitis after cat  bite Received Unasyn 4 times daily, DC'd on 10/28/2022 Change to Augmentin twice daily x 7 days. Blood cultures negative to date. CT right wrist no evidence of complications.   Hyperlipidemia Continue home Zetia and Zocor   Chronic anxiety/depression Continue home bupropion   Asthma Continue home regimen   Discharge Exam: BP 138/89 (BP Location: Left Arm)   Pulse 63   Temp 98 F (36.7 C) (Oral)   Resp 18   SpO2 96%  General: 77 y.o. year-old male well developed well nourished in no acute distress.  Alert and oriented x3. Cardiovascular: Regular rate and rhythm with no rubs or gallops.  No thyromegaly or JVD noted.   Respiratory: Clear to auscultation with no wheezes or rales. Good inspiratory effort. Abdomen: Soft nontender nondistended with normal bowel sounds x4 quadrants. Musculoskeletal: No lower extremity edema. 2/4 pulses in all 4 extremities. Skin: Improved, minimal erythema from right wrist. Psychiatry: Mood is appropriate for condition and setting  Discharge Instructions You were cared for by a hospitalist during your hospital stay. If you have any questions about your discharge medications or the care you received while you were in the hospital after you are discharged, you can call the unit and asked to speak with the hospitalist on call if the hospitalist that took care of you is not available. Once you are discharged, your primary care physician will handle any further medical issues. Please note that NO REFILLS for any discharge medications will be authorized once you are discharged, as it is imperative  that you return to your primary care physician (or establish a relationship with a primary care physician if you do not have one) for your aftercare needs so that they can reassess your need for medications and monitor your lab values.   Allergies as of 11/07/2022   No Known Allergies      Medication List     STOP taking these medications     budesonide-formoterol 160-4.5 MCG/ACT inhaler Commonly known as: SYMBICORT       TAKE these medications    albuterol 108 (90 Base) MCG/ACT inhaler Commonly known as: VENTOLIN HFA Inhale 1-2 puffs into the lungs every 6 (six) hours as needed for wheezing or shortness of breath.   amoxicillin-clavulanate 875-125 MG tablet Commonly known as: AUGMENTIN Take 1 tablet by mouth every 12 (twelve) hours for 7 days. What changed: when to take this   buPROPion 300 MG 24 hr tablet Commonly known as: WELLBUTRIN XL TAKE 1 TABLET(300 MG) BY MOUTH DAILY What changed:  how much to take how to take this when to take this additional instructions   ezetimibe 10 MG tablet Commonly known as: ZETIA Take 1 tablet (10 mg total) by mouth daily.   meloxicam 15 MG tablet Commonly known as: MOBIC Take 1 tablet (15 mg total) by mouth daily.   multivitamin tablet Take 1 tablet by mouth daily with breakfast.   simvastatin 40 MG tablet Commonly known as: ZOCOR Take 1 tablet (40 mg total) by mouth daily. What changed: when to take this   traZODone 50 MG tablet Commonly known as: DESYREL Take 1 tablet (50 mg total) by mouth at bedtime as needed for sleep.   TYLENOL 500 MG tablet Generic drug: acetaminophen Take 500 mg by mouth every 6 (six) hours as needed for mild pain or headache.       No Known Allergies  Follow-up Information     Olevia Perches P, DO. Call today.   Specialty: Family Medicine Why: Please call for a posthospital follow up appointment. Contact information: [address] [place] [address] [phone]                  The results of significant diagnostics from this hospitalization (including imaging, microbiology, ancillary and laboratory) are listed below for reference.    Significant Diagnostic Studies: CT WRIST RIGHT W CONTRAST  Result Date: 11/06/2022 CLINICAL DATA:  Joint effusion, cat bite EXAM: CT OF THE UPPER RIGHT EXTREMITY WITH CONTRAST  TECHNIQUE: Multidetector CT imaging of the upper right extremity was performed according to the standard protocol following intravenous contrast administration. RADIATION DOSE REDUCTION: This exam was performed according to the departmental dose-optimization program which includes automated exposure control, adjustment of the mA and/or kV according to patient size and/or use of iterative reconstruction technique. CONTRAST:  OMNIPAQUE IOHEXOL 300 MG/ML  SOLN COMPARISON:  None Available. FINDINGS: Bones/Joint/Cartilage No acute bony abnormality. Specifically, no acute fracture, subluxation, or dislocation. Widening of the scapholunate space with calcifications within the space. This could be related to old injury. No joint effusion. Ligaments Suboptimally assessed by CT. Muscles and Tendons Grossly unremarkable Soft tissues No focal fluid collection to suggest abscess. IMPRESSION: No acute fracture.  No joint effusion. No focal fluid collection to suggest soft tissue abscess. Widening of the scapholunate distance compatible with scapholunate dissociation. Calcifications within the space may be related to old injury. Electronically Signed   By: Charlett Nose M.D.   On: 11/06/2022 18:55    Microbiology: Recent Results (from the  past 240 hour(s))  Culture, blood (Routine X 2) w Reflex to ID Panel     Status: None (Preliminary result)   Collection Time: 11/06/22  2:33 PM   Specimen: BLOOD RIGHT ARM  Result Value Ref Range Status   Specimen Description   Final    BLOOD RIGHT ARM Performed at [facility], [address], [place], [place] [address]    Special Requests   Final    BOTTLES DRAWN AEROBIC AND ANAEROBIC Blood Culture adequate volume Performed at [facility], [address], [place], [place] [address]    Culture   Final    NO GROWTH < 12 HOURS Performed at [facility], [address] [place], [place] [address]    Report Status PENDING   Incomplete  Culture, blood (Routine X 2) w Reflex to ID Panel     Status: None (Preliminary result)   Collection Time: 11/06/22  2:38 PM   Specimen: BLOOD LEFT HAND  Result Value Ref Range Status   Specimen Description   Final    BLOOD LEFT HAND Performed at [facility], [address], [place], [place] [address]    Special Requests   Final    BOTTLES DRAWN AEROBIC AND ANAEROBIC Blood Culture results may not be optimal due to an inadequate volume of blood received in culture bottles Performed at [facility], [address], [place], [place] [address]    Culture   Final    NO GROWTH < 12 HOURS Performed at [facility], [address], [place], [place] [address]    Report Status PENDING  Incomplete  MRSA Next Gen by PCR, Nasal     Status: None   Collection Time: 11/07/22  6:31 AM   Specimen: Nasal Mucosa; Nasal Swab  Result Value Ref Range Status   MRSA by PCR Next Gen NOT DETECTED NOT DETECTED Final    Comment: (NOTE) The GeneXpert MRSA Assay (FDA approved for NASAL specimens only), is one component of a comprehensive MRSA colonization surveillance program. It is not intended to diagnose MRSA infection nor to guide or monitor treatment for MRSA infections. Test performance is not FDA approved in patients less than 40 years old. Performed at [facility], [address], [place], [place] [address]      Labs: Basic Metabolic Panel: Recent Labs  Lab 11/04/22 0954 11/05/22 0754 11/06/22 1433  NA 135 136 136  K 4.1 3.9 3.4*  CL 102 104 105  CO2 24 21* 22  GLUCOSE 110* 113* 165*  BUN 20 18 20   CREATININE 0.80 0.74 0.82  CALCIUM 9.2 8.6* 8.6*  MG  --   --  2.2   Liver Function Tests: Recent Labs  Lab 11/04/22 0954 11/06/22 1433  AST 21 21  ALT 22 19  ALKPHOS 86 79  BILITOT 1.8* 1.1  PROT 7.0 6.6  ALBUMIN 4.2 3.3*   Recent Labs  Lab 11/04/22 0954  LIPASE 10*   No results for input(s):  "AMMONIA" in the last 168 hours. CBC: Recent Labs  Lab 11/04/22 0954 11/05/22 0754 11/06/22 1433  WBC 12.0* 10.7* 7.9  NEUTROABS 9.9*  --  6.2  HGB 16.1 15.1 15.3  HCT 47.2 46.1 46.2  MCV 93.7 97.1 96.0  PLT 173 175 171   Cardiac Enzymes: No results for input(s): "CKTOTAL", "CKMB", "CKMBINDEX", "TROPONINI" in the last 168 hours. BNP: BNP (last 3 results) No results for input(s): "BNP" in  the last 8760 hours.  ProBNP (last 3 results) No results for input(s): "PROBNP" in the last 8760 hours.  CBG: No results for input(s): "GLUCAP" in the last 168 hours.     Signed:  Darlin Drop, MD [facility] 11/07/2022, 5:55 PM

## 2022-11-11 LAB — CULTURE, BLOOD (ROUTINE X 2)
Culture: NO GROWTH
Culture: NO GROWTH
Special Requests: ADEQUATE

## 2023-01-31 ENCOUNTER — Other Ambulatory Visit: Payer: Self-pay | Admitting: Family Medicine

## 2023-02-02 NOTE — Telephone Encounter (Signed)
All labs in date.  Requested Prescriptions  Pending Prescriptions Disp Refills   meloxicam (MOBIC) 15 MG tablet [Pharmacy Med Name: MELOXICAM 15 MG TABLET] 90 tablet 0    Sig: TAKE 1 TABLET BY MOUTH EVERY DAY     Analgesics:  COX2 Inhibitors Failed - 02/02/2023  7:49 AM      Failed - Manual Review: Labs are only required if the patient has taken medication for more than 8 weeks.      Passed - HGB in normal range and within 360 days    Hemoglobin  Date Value Ref Range Status  11/06/2022 15.3 13.0 - 17.0 g/dL Final  45/40/9811 91.4 13.0 - 17.7 g/dL Final         Passed - Cr in normal range and within 360 days    Creat  Date Value Ref Range Status  01/19/2013 1.06 0.50 - 1.35 mg/dL Final   Creatinine, Ser  Date Value Ref Range Status  11/06/2022 0.82 0.61 - 1.24 mg/dL Final         Passed - HCT in normal range and within 360 days    HCT  Date Value Ref Range Status  11/06/2022 46.2 39.0 - 52.0 % Final   Hematocrit  Date Value Ref Range Status  10/26/2022 49.1 37.5 - 51.0 % Final         Passed - AST in normal range and within 360 days    AST  Date Value Ref Range Status  11/06/2022 21 15 - 41 U/L Final         Passed - ALT in normal range and within 360 days    ALT  Date Value Ref Range Status  11/06/2022 19 0 - 44 U/L Final         Passed - eGFR is 30 or above and within 360 days    GFR calc Af Amer  Date Value Ref Range Status  11/04/2016 88 >59 mL/min/1.73 Final   GFR, Estimated  Date Value Ref Range Status  11/06/2022 >60 >60 mL/min Final    Comment:    (NOTE) Calculated using the CKD-EPI Creatinine Equation (2021)    eGFR  Date Value Ref Range Status  10/26/2022 72 >59 mL/min/1.73 Final         Passed - Patient is not pregnant      Passed - Valid encounter within last 12 months    Recent Outpatient Visits           3 months ago Encounter for [employer] annual wellness exam   [place] [facility] [place], Megan P, DO   10  months ago Mild intermittent asthma without complication   [place] [facility] [place], Megan P, DO   11 months ago SOB (shortness of breath)   [place] [facility] [place], Megan P, DO   1 year ago IFG (impaired fasting glucose)   [facility] [place], Megan P, DO   1 year ago Routine general medical examination at a health care facility   [facility] Dorcas Carrow, DO       Future Appointments             In 2 months Laural Benes, Oralia Rud, DO  [facility]             ezetimibe (ZETIA) 10 MG tablet [Pharmacy Med Name: EZETIMIBE 10 MG TABLET] 90 tablet 0    Sig: TAKE 1 TABLET  BY MOUTH EVERY DAY     Cardiovascular:  Antilipid - Sterol Transport Inhibitors Failed - 02/02/2023  7:49 AM      Failed - Lipid Panel in normal range within the last 12 months    Cholesterol, Total  Date Value Ref Range Status  10/26/2022 135 100 - 199 mg/dL Final   LDL Chol Calc (NIH)  Date Value Ref Range Status  10/26/2022 68 0 - 99 mg/dL Final   HDL  Date Value Ref Range Status  10/26/2022 45 >39 mg/dL Final   Triglycerides  Date Value Ref Range Status  10/26/2022 120 0 - 149 mg/dL Final         Passed - AST in normal range and within 360 days    AST  Date Value Ref Range Status  11/06/2022 21 15 - 41 U/L Final         Passed - ALT in normal range and within 360 days    ALT  Date Value Ref Range Status  11/06/2022 19 0 - 44 U/L Final         Passed - Patient is not pregnant      Passed - Valid encounter within last 12 months    Recent Outpatient Visits           3 months ago Encounter for [employer] annual wellness exam   [facility] [place], Megan P, DO   10 months ago Mild intermittent asthma without complication   [place] [facility] [place], Megan P, DO   11 months ago SOB (shortness of breath)   [place] [facility] [place], Megan P, DO   1 year ago IFG (impaired fasting glucose)   [place] [place], Megan P, DO   1 year ago Routine general medical examination at a health care facility   [facility] Dorcas Carrow, DO       Future Appointments             In 2 months Laural Benes, Oralia Rud, DO [place] [facility]             traZODone (DESYREL) 50 MG tablet [Pharmacy Med Name: TRAZODONE 50 MG TABLET] 90 tablet 0    Sig: TAKE 1 TABLET BY MOUTH AT BEDTIME AS NEEDED FOR SLEEP.     Psychiatry: Antidepressants - Serotonin Modulator Passed - 02/02/2023  7:49 AM      Passed - Completed PHQ-2 or PHQ-9 in the last 360 days      Passed - Valid encounter within last 6 months    Recent Outpatient Visits           3 months ago Encounter for [employer] annual wellness exam   [place] [facility] [place], Megan P, DO   10 months ago Mild intermittent asthma without complication   [facility], Megan P, DO   11 months ago SOB (shortness of breath)   [facility], Megan P, DO   1 year ago IFG (impaired fasting glucose)   [place] [place], Megan P, DO   1 year ago Routine general medical examination at a health care facility   [facility] [place], [place] P, DO       Future Appointments             In 2 months Dorcas Carrow, DO [facility]             buPROPion (WELLBUTRIN XL) 300 MG 24 hr tablet [Pharmacy Med Name: BUPROPION HCL XL 300 MG TABLET] 90 tablet 0    Sig: TAKE 1 TABLET(300 MG) BY MOUTH DAILY     Psychiatry: Antidepressants - bupropion Passed - 02/02/2023  7:49 AM      Passed - Cr in normal range and within 360 days    Creat  Date Value Ref Range Status  01/19/2013 1.06 0.50 - 1.35 mg/dL Final   Creatinine, Ser  Date Value Ref  Range Status  11/06/2022 0.82 0.61 - 1.24 mg/dL Final         Passed - AST in normal range and within 360 days    AST  Date Value Ref Range Status  11/06/2022 21 15 - 41 U/L Final         Passed - ALT in normal range and within 360 days    ALT  Date Value Ref Range Status  11/06/2022 19 0 - 44 U/L Final         Passed - Completed PHQ-2 or PHQ-9 in the last 360 days      Passed - Last BP in normal range    BP Readings from Last 1 Encounters:  11/07/22 138/89         Passed - Valid encounter within last 6 months    Recent Outpatient Visits           3 months ago Encounter for [employer] annual wellness exam   [place] [facility] [place], Megan P, DO   10 months ago Mild intermittent asthma without complication   [facility] [place], Megan P, DO   11 months ago SOB (shortness of breath)   [place] [facility] [place], Megan P, DO   1 year ago IFG (impaired fasting glucose)   [place] [place], Megan P, DO   1 year ago Routine general medical examination at a health care facility   [facility] Dorcas Carrow, DO       Future Appointments             In 2 months Dorcas Carrow, DO [place] [facility]

## 2023-02-14 ENCOUNTER — Ambulatory Visit: Payer: Self-pay

## 2023-02-14 NOTE — Telephone Encounter (Signed)
 Chief Complaint: Medication Question  Symptoms: None Disposition: [] ED /[] Urgent Care (no appt availability in office) / [x] Appointment(In office/virtual)/ []  [place] [facility]/ [] Home Care/ [] Refused Recommended Disposition /[] [place] [facility]/ []  Follow-up with PCP Additional Notes: Spoke to patient's wife lisa (signed DPR on file). Olam stated she and patient are going on a trip on 03/03/23 and patient wanted to take some Viagra . They found a old bottle of the medicine but it is 76 years old. Olam wanted to know if it was safe to take or if he needed a new Rx. Care advice given and advised not to take the old medication. PCP does not have any appointments available before their scheduled trip.  Viagra  last ordered by PCP but discontinued because patient stated he was not using it. Patient has been scheduled for a MyChart video visit on 02/17/23 with another provider. Patient stated if he does not need an appointment he would like the [facility] Rx [facility] that [facility] in chart.  Summary: question on two year old medicaton   Pt wife is calling in stating that they are going on a trip and the pt has generic Viagra  that is two years old and she does not know if it is still safe to use or if the doctor need to call him in another prescription or if he as to be seen for a new prescription. Please call Deven Furia (wife) back.     Reason for Disposition  Prescription request for new medicine (not a refill)  Answer Assessment - Initial Assessment Questions 1. NAME of MEDICINE: What medicine(s) are you calling about?     Viagra  2. QUESTION: What is your question? (e.g., double dose of medicine, side effect)     Is it okay to take Viagra  that expired 2 years ago? 3. PRESCRIBER: Who prescribed the medicine? Reason: if prescribed by specialist, call should be referred to that group.     Dr. Vicci  4. SYMPTOMS: Do you have any symptoms? If Yes, ask: What symptoms are you having?  How  bad are the symptoms (e.g., mild, moderate, severe)     None 5. PREGNANCY:  Is there any chance that you are pregnant? When was your last menstrual period?     N/A  Protocols used: Medication Question Call-A-AH

## 2023-02-14 NOTE — Telephone Encounter (Signed)
 Pt's wife called, left VM to return the call to the office to speak to the NT.   Spoke with Brittany, CMA and per her ok to send to provider for decision on refill vs needing appt.   sildenafil  (VIAGRA ) 100 MG tablet  last fill, 12/09/2016, dc'd by [facility]/29/2020 d/t pt preference  Summary: question on two year old medicaton   Pt wife is calling in stating that they are going on a trip and the pt has generic Viagra  that is two years old and she does not know if it is still safe to use or if the doctor need to call him in another prescription or if he as to be seen for a new prescription. Please call Wayne Robles (wife) back.

## 2023-02-15 NOTE — Telephone Encounter (Signed)
 Pt already has an appointment on 02/17/2023 with another provider.

## 2023-02-17 ENCOUNTER — Telehealth (INDEPENDENT_AMBULATORY_CARE_PROVIDER_SITE_OTHER): Payer: Medicare HMO | Admitting: Family Medicine

## 2023-02-17 ENCOUNTER — Encounter: Payer: Self-pay | Admitting: Family Medicine

## 2023-02-17 DIAGNOSIS — N529 Male erectile dysfunction, unspecified: Secondary | ICD-10-CM | POA: Insufficient documentation

## 2023-02-17 MED ORDER — SILDENAFIL CITRATE 100 MG PO TABS: 50.0000 mg | ORAL_TABLET | Freq: Every day | ORAL | 11 refills | Status: DC | PRN

## 2023-02-17 NOTE — Progress Notes (Signed)
 There were no vitals taken for this visit.   Subjective:    Patient ID: Wayne Robles, male    DOB: 12-01-1945, 78 y.o.   MRN: [id number]  HPI: Wayne Robles is a 78 y.o. male  Chief Complaint  Patient presents with   Erectile Dysfunction   Had had issues with ED in the past. Was on viagra  previously from here and his PCP in [place] . Uses medication occasionally. Tolerates it well. No concerns. Would like a refill. Otherwise feeling well. No other concerns or complaints at this time.   Relevant past medical, surgical, family and social history reviewed and updated as indicated. Interim medical history since our last visit reviewed. Allergies and medications reviewed and updated.  Review of Systems  Constitutional: Negative.   Respiratory: Negative.    Cardiovascular: Negative.   Musculoskeletal: Negative.   Skin: Negative.   Psychiatric/Behavioral: Negative.      Per HPI unless specifically indicated above     Objective:    There were no vitals taken for this visit.  Wt Readings from Last 3 Encounters:  10/26/22 184 lb 6.4 oz (83.6 kg)  04/04/22 186 lb 14.4 oz (84.8 kg)  02/25/22 185 lb (83.9 kg)    Physical Exam Constitutional:      General: He is not in acute distress.    Appearance: Normal appearance. He is well-developed. He is not ill-appearing, toxic-appearing or diaphoretic.  HENT:     Head: Normocephalic and atraumatic.     Right Ear: Hearing and external ear normal.     Left Ear: Hearing and external ear normal.     Nose: Nose normal.  Eyes:     General: Lids are normal. No scleral icterus.       Right eye: No discharge.        Left eye: No discharge.     Extraocular Movements: Extraocular movements intact.     Conjunctiva/sclera: Conjunctivae normal.     Pupils: Pupils are equal, round, and reactive to light.  Pulmonary:     Effort: Pulmonary effort is normal. No respiratory distress.  Musculoskeletal:        General: Normal range of motion.      Cervical back: Normal range of motion.  Skin:    Coloration: Skin is not jaundiced or pale.     Findings: No bruising, erythema, lesion or rash.  Neurological:     General: No focal deficit present.     Mental Status: He is alert and oriented to person, place, and time.  Psychiatric:        Mood and Affect: Mood normal.        Speech: Speech normal.        Behavior: Behavior normal.        Thought Content: Thought content normal.        Judgment: Judgment normal.     Results for orders placed or performed during the hospital encounter of 11/04/22  CBC with Differential   Collection Time: 11/04/22  9:54 AM  Result Value Ref Range   WBC 12.0 (H) 4.0 - 10.5 K/uL   RBC 5.04 4.22 - 5.81 MIL/uL   Hemoglobin 16.1 13.0 - 17.0 g/dL   HCT 52.7 60.9 - 47.9 %   MCV 93.7 80.0 - 100.0 fL   MCH 31.9 26.0 - 34.0 pg   MCHC 34.1 30.0 - 36.0 g/dL   RDW 87.1 88.4 - 84.4 %   Platelets 173 150 - 400 K/uL   nRBC  0.0 0.0 - 0.2 %   Neutrophils Relative % 83 %   Neutro Abs 9.9 (H) 1.7 - 7.7 K/uL   Lymphocytes Relative 9 %   Lymphs Abs 1.1 0.7 - 4.0 K/uL   Monocytes Relative 7 %   Monocytes Absolute 0.8 0.1 - 1.0 K/uL   Eosinophils Relative 1 %   Eosinophils Absolute 0.2 0.0 - 0.5 K/uL   Basophils Relative 0 %   Basophils Absolute 0.0 0.0 - 0.1 K/uL   Immature Granulocytes 0 %   Abs Immature Granulocytes 0.04 0.00 - 0.07 K/uL  Comprehensive metabolic panel   Collection Time: 11/04/22  9:54 AM  Result Value Ref Range   Sodium 135 135 - 145 mmol/L   Potassium 4.1 3.5 - 5.1 mmol/L   Chloride 102 98 - 111 mmol/L   CO2 24 22 - 32 mmol/L   Glucose, Bld 110 (H) 70 - 99 mg/dL   BUN 20 8 - 23 mg/dL   Creatinine, Ser 9.19 0.61 - 1.24 mg/dL   Calcium 9.2 8.9 - 89.6 mg/dL   Total Protein 7.0 6.5 - 8.1 g/dL   Albumin 4.2 3.5 - 5.0 g/dL   AST 21 15 - 41 U/L   ALT 22 0 - 44 U/L   Alkaline Phosphatase 86 38 - 126 U/L   Total Bilirubin 1.8 (H) 0.3 - 1.2 mg/dL   GFR, Estimated >39 >39 mL/min    Anion gap 9 5 - 15  Lipase, blood   Collection Time: 11/04/22  9:54 AM  Result Value Ref Range   Lipase 10 (L) 11 - 51 U/L  Basic metabolic panel   Collection Time: 11/05/22  7:54 AM  Result Value Ref Range   Sodium 136 135 - 145 mmol/L   Potassium 3.9 3.5 - 5.1 mmol/L   Chloride 104 98 - 111 mmol/L   CO2 21 (L) 22 - 32 mmol/L   Glucose, Bld 113 (H) 70 - 99 mg/dL   BUN 18 8 - 23 mg/dL   Creatinine, Ser 9.25 0.61 - 1.24 mg/dL   Calcium 8.6 (L) 8.9 - 10.3 mg/dL   GFR, Estimated >39 >39 mL/min   Anion gap 11 5 - 15  CBC   Collection Time: 11/05/22  7:54 AM  Result Value Ref Range   WBC 10.7 (H) 4.0 - 10.5 K/uL   RBC 4.75 4.22 - 5.81 MIL/uL   Hemoglobin 15.1 13.0 - 17.0 g/dL   HCT 53.8 60.9 - 47.9 %   MCV 97.1 80.0 - 100.0 fL   MCH 31.8 26.0 - 34.0 pg   MCHC 32.8 30.0 - 36.0 g/dL   RDW 86.9 88.4 - 84.4 %   Platelets 175 150 - 400 K/uL   nRBC 0.0 0.0 - 0.2 %  Culture, blood (Routine X 2) w Reflex to ID Panel   Collection Time: 11/06/22  2:33 PM   Specimen: BLOOD RIGHT ARM  Result Value Ref Range   Specimen Description      BLOOD RIGHT ARM Performed at [facility], [address] [place], [place] [address]    Special Requests      BOTTLES DRAWN AEROBIC AND ANAEROBIC Blood Culture adequate volume Performed at [facility], [address] [place], [place] [address]    Culture      NO GROWTH 5 DAYS Performed at [facility], [address], [place], [place] [address]    Report Status 11/11/2022 FINAL   CBC with Differential/Platelet   Collection  Time: 11/06/22  2:33 PM  Result Value Ref Range   WBC 7.9 4.0 - 10.5 K/uL   RBC 4.81 4.22 - 5.81 MIL/uL   Hemoglobin 15.3 13.0 - 17.0 g/dL   HCT 53.7 60.9 - 47.9 %   MCV 96.0 80.0 - 100.0 fL   MCH 31.8 26.0 - 34.0 pg   MCHC 33.1 30.0 - 36.0 g/dL   RDW 87.1 88.4 - 84.4 %   Platelets 171 150 - 400 K/uL   nRBC 0.0 0.0 - 0.2 %   Neutrophils Relative % 78 %   Neutro Abs 6.2 1.7 -  7.7 K/uL   Lymphocytes Relative 13 %   Lymphs Abs 1.0 0.7 - 4.0 K/uL   Monocytes Relative 5 %   Monocytes Absolute 0.4 0.1 - 1.0 K/uL   Eosinophils Relative 3 %   Eosinophils Absolute 0.3 0.0 - 0.5 K/uL   Basophils Relative 0 %   Basophils Absolute 0.0 0.0 - 0.1 K/uL   Immature Granulocytes 1 %   Abs Immature Granulocytes 0.04 0.00 - 0.07 K/uL  Comprehensive metabolic panel   Collection Time: 11/06/22  2:33 PM  Result Value Ref Range   Sodium 136 135 - 145 mmol/L   Potassium 3.4 (L) 3.5 - 5.1 mmol/L   Chloride 105 98 - 111 mmol/L   CO2 22 22 - 32 mmol/L   Glucose, Bld 165 (H) 70 - 99 mg/dL   BUN 20 8 - 23 mg/dL   Creatinine, Ser 9.17 0.61 - 1.24 mg/dL   Calcium 8.6 (L) 8.9 - 10.3 mg/dL   Total Protein 6.6 6.5 - 8.1 g/dL   Albumin 3.3 (L) 3.5 - 5.0 g/dL   AST 21 15 - 41 U/L   ALT 19 0 - 44 U/L   Alkaline Phosphatase 79 38 - 126 U/L   Total Bilirubin 1.1 0.3 - 1.2 mg/dL   GFR, Estimated >39 >39 mL/min   Anion gap 9 5 - 15  Magnesium   Collection Time: 11/06/22  2:33 PM  Result Value Ref Range   Magnesium 2.2 1.7 - 2.4 mg/dL  Culture, blood (Routine X 2) w Reflex to ID Panel   Collection Time: 11/06/22  2:38 PM   Specimen: BLOOD LEFT HAND  Result Value Ref Range   Specimen Description      BLOOD LEFT HAND Performed at [facility], [address], [place], [place] [address]    Special Requests      BOTTLES DRAWN AEROBIC AND ANAEROBIC Blood Culture results may not be optimal due to an inadequate volume of blood received in culture bottles Performed at [facility], [address], [place], [place] [address]    Culture      NO GROWTH 5 DAYS Performed at [facility], [address], [place], [place] [address]    Report Status 11/11/2022 FINAL   MRSA Next Gen by PCR, Nasal   Collection Time: 11/07/22  6:31 AM   Specimen: Nasal Mucosa; Nasal Swab  Result Value Ref Range   MRSA by PCR Next Gen NOT DETECTED NOT DETECTED       Assessment & Plan:   Problem List Items Addressed This Visit       Other   Erectile dysfunction - Primary   Previously did well with viagra , but is out of his prescription now. Rx sent to his pharmacy. Call with any concerns.         Follow up plan: Return for As scheduled.  This visit was completed via video visit through MyChart due to the restrictions of the COVID-19 pandemic. All issues as above were discussed and addressed. Physical exam was done as above through visual confirmation on video through MyChart. If it was felt that the patient should be evaluated in the office, they were directed there. The patient verbally consented to this visit. Location of the patient: home Location of the provider: work Those involved with this call:  Provider: Duwaine Louder, DO CMA: Doyce Croak, CMA Front Desk/Registration:  Claretta Maiden   Time spent on call:  15 minutes with patient face to face via video conference. More than 50% of this time was spent in counseling and coordination of care. 23 minutes total spent in review of patient's record and preparation of their chart.

## 2023-02-17 NOTE — Assessment & Plan Note (Signed)
 Previously did well with viagra, but is out of his prescription now. Rx sent to his pharmacy. Call with any concerns.

## 2023-03-15 ENCOUNTER — Telehealth: Payer: Medicare HMO | Admitting: Nurse Practitioner

## 2023-03-15 ENCOUNTER — Encounter: Payer: Self-pay | Admitting: Nurse Practitioner

## 2023-03-15 DIAGNOSIS — R051 Acute cough: Secondary | ICD-10-CM | POA: Diagnosis not present

## 2023-03-15 DIAGNOSIS — R059 Cough, unspecified: Secondary | ICD-10-CM | POA: Insufficient documentation

## 2023-03-15 MED ORDER — HYDROCOD POLI-CHLORPHE POLI ER 10-8 MG/5ML PO SUER: 5.0000 mL | Freq: Every evening | ORAL | 0 refills | Status: DC | PRN

## 2023-03-15 MED ORDER — PREDNISONE 20 MG PO TABS: 40.0000 mg | ORAL_TABLET | Freq: Every day | ORAL | 0 refills | Status: AC

## 2023-03-15 NOTE — Assessment & Plan Note (Signed)
 Present for 3 days, started symptoms after coming back from cruise.  They will obtain flu/Covid combo test and perform at home then notify provider of results, he is in time period for Covid treatment if present.  For now will send in 5 days of 40 MG Prednisone  and Tussionex for cough and sleep. Symptoms currently more viral upper respiratory infection in nature. Discussed that these usually run their course in 5-7 days and antibiotics don't work against viruses, but increase risk of side effects. Recommend: - Increased rest - Increasing Fluids - Acetaminophen  as needed for fever/pain.  - Salt water gargling, chloraseptic spray and throat lozenge - Mucinex.  - Saline sinus flushes or a neti pot.  - Humidifying the air.  - If ongoing or worsening by Monday next week alert provider and will send in abx at that time.

## 2023-03-15 NOTE — Patient Instructions (Signed)

## 2023-03-15 NOTE — Progress Notes (Signed)
 There were no vitals taken for this visit.   Subjective:    Patient ID: Wayne Robles, male    DOB: 1945/04/12, 78 y.o.   MRN: [id number]  HPI: Wayne Robles is a 78 y.o. male  Chief Complaint  Patient presents with   URI    Patient states he has been having congestion, sinus pressure, cough, and sore throat since Monday. States he has tried a few OTC meds but nothing has really helped.    Virtual Visit via Video Note  I connected with Wayne Robles on 03/15/23 at  9:20 AM EST by a video enabled telemedicine application and verified that I am speaking with the correct person using two identifiers.  Location: Patient: home Provider: work   I discussed the limitations of evaluation and management by telemedicine and the availability of in person appointments. The patient expressed understanding and agreed to proceed.  I discussed the assessment and treatment plan with the patient. The patient was provided an opportunity to ask questions and all were answered. The patient agreed with the plan and demonstrated an understanding of the instructions.   The patient was advised to call back or seek an in-person evaluation if the symptoms worsen or if the condition fails to improve as anticipated.  I provided 25 minutes of non-face-to-face time during this encounter.   Carlyon Nolasco T Daiquan Resnik, NP   UPPER RESPIRATORY TRACT INFECTION Started on Monday with sore throat, headache, pressure behind eyes.  Has not Covid or Flu tested at home.  Feels a little better than yesterday. Fever: no Cough: yes Shortness of breath: a little Wheezing: no Chest pain: no Chest tightness: no Chest congestion: no Nasal congestion: yes Runny nose: yes Post nasal drip: yes Sneezing: no Sore throat: yes Swollen glands: no Sinus pressure: yes Headache: yes Face pain: yes Toothache: no Ear pain: none Ear pressure: none Eyes red/itching:no Eye drainage/crusting: no  Vomiting: no Rash:  no Fatigue: yes Sick contacts: yes just got back from cruise Strep contacts: no  Context: fluctuating Recurrent sinusitis: no Relief with OTC cold/cough medications: no  Treatments attempted: cold/sinus    Relevant past medical, surgical, family and social history reviewed and updated as indicated. Interim medical history since our last visit reviewed. Allergies and medications reviewed and updated.  Review of Systems  Constitutional:  Positive for fatigue. Negative for activity change, appetite change, chills, diaphoresis and fever.  HENT:  Positive for congestion, postnasal drip, rhinorrhea, sinus pressure and sore throat. Negative for ear discharge, ear pain, sinus pain, sneezing and voice change.   Respiratory:  Positive for cough and shortness of breath. Negative for chest tightness and wheezing.   Cardiovascular:  Negative for chest pain, palpitations and leg swelling.  Gastrointestinal: Negative.   Neurological:  Positive for headaches.  Psychiatric/Behavioral: Negative.      Per HPI unless specifically indicated above     Objective:    There were no vitals taken for this visit.  Wt Readings from Last 3 Encounters:  10/26/22 184 lb 6.4 oz (83.6 kg)  04/04/22 186 lb 14.4 oz (84.8 kg)  02/25/22 185 lb (83.9 kg)    Physical Exam Vitals and nursing note reviewed.  Constitutional:      General: He is awake. He is not in acute distress.    Appearance: He is well-developed and well-groomed. He is ill-appearing. He is not toxic-appearing.  HENT:     Head: Normocephalic.     Right Ear: Hearing normal. No drainage.  Left Ear: Hearing normal. No drainage.  Eyes:     General: Lids are normal.        Right eye: No discharge.        Left eye: No discharge.     Conjunctiva/sclera: Conjunctivae normal.  Pulmonary:     Effort: Pulmonary effort is normal. No accessory muscle usage or respiratory distress.  Musculoskeletal:     Cervical back: Normal range of motion.   Neurological:     Mental Status: He is alert and oriented to person, place, and time.  Psychiatric:        Mood and Affect: Mood normal.        Behavior: Behavior normal. Behavior is cooperative.        Thought Content: Thought content normal.        Judgment: Judgment normal.     Results for orders placed or performed during the hospital encounter of 11/04/22  CBC with Differential   Collection Time: 11/04/22  9:54 AM  Result Value Ref Range   WBC 12.0 (H) 4.0 - 10.5 K/uL   RBC 5.04 4.22 - 5.81 MIL/uL   Hemoglobin 16.1 13.0 - 17.0 g/dL   HCT 52.7 60.9 - 47.9 %   MCV 93.7 80.0 - 100.0 fL   MCH 31.9 26.0 - 34.0 pg   MCHC 34.1 30.0 - 36.0 g/dL   RDW 87.1 88.4 - 84.4 %   Platelets 173 150 - 400 K/uL   nRBC 0.0 0.0 - 0.2 %   Neutrophils Relative % 83 %   Neutro Abs 9.9 (H) 1.7 - 7.7 K/uL   Lymphocytes Relative 9 %   Lymphs Abs 1.1 0.7 - 4.0 K/uL   Monocytes Relative 7 %   Monocytes Absolute 0.8 0.1 - 1.0 K/uL   Eosinophils Relative 1 %   Eosinophils Absolute 0.2 0.0 - 0.5 K/uL   Basophils Relative 0 %   Basophils Absolute 0.0 0.0 - 0.1 K/uL   Immature Granulocytes 0 %   Abs Immature Granulocytes 0.04 0.00 - 0.07 K/uL  Comprehensive metabolic panel   Collection Time: 11/04/22  9:54 AM  Result Value Ref Range   Sodium 135 135 - 145 mmol/L   Potassium 4.1 3.5 - 5.1 mmol/L   Chloride 102 98 - 111 mmol/L   CO2 24 22 - 32 mmol/L   Glucose, Bld 110 (H) 70 - 99 mg/dL   BUN 20 8 - 23 mg/dL   Creatinine, Ser 9.19 0.61 - 1.24 mg/dL   Calcium 9.2 8.9 - 89.6 mg/dL   Total Protein 7.0 6.5 - 8.1 g/dL   Albumin 4.2 3.5 - 5.0 g/dL   AST 21 15 - 41 U/L   ALT 22 0 - 44 U/L   Alkaline Phosphatase 86 38 - 126 U/L   Total Bilirubin 1.8 (H) 0.3 - 1.2 mg/dL   GFR, Estimated >39 >39 mL/min   Anion gap 9 5 - 15  Lipase, blood   Collection Time: 11/04/22  9:54 AM  Result Value Ref Range   Lipase 10 (L) 11 - 51 U/L  Basic metabolic panel   Collection Time: 11/05/22  7:54 AM  Result  Value Ref Range   Sodium 136 135 - 145 mmol/L   Potassium 3.9 3.5 - 5.1 mmol/L   Chloride 104 98 - 111 mmol/L   CO2 21 (L) 22 - 32 mmol/L   Glucose, Bld 113 (H) 70 - 99 mg/dL   BUN 18 8 - 23 mg/dL   Creatinine, Ser 9.25  0.61 - 1.24 mg/dL   Calcium 8.6 (L) 8.9 - 10.3 mg/dL   GFR, Estimated >39 >39 mL/min   Anion gap 11 5 - 15  CBC   Collection Time: 11/05/22  7:54 AM  Result Value Ref Range   WBC 10.7 (H) 4.0 - 10.5 K/uL   RBC 4.75 4.22 - 5.81 MIL/uL   Hemoglobin 15.1 13.0 - 17.0 g/dL   HCT 53.8 60.9 - 47.9 %   MCV 97.1 80.0 - 100.0 fL   MCH 31.8 26.0 - 34.0 pg   MCHC 32.8 30.0 - 36.0 g/dL   RDW 86.9 88.4 - 84.4 %   Platelets 175 150 - 400 K/uL   nRBC 0.0 0.0 - 0.2 %  Culture, blood (Routine X 2) w Reflex to ID Panel   Collection Time: 11/06/22  2:33 PM   Specimen: BLOOD RIGHT ARM  Result Value Ref Range   Specimen Description      BLOOD RIGHT ARM Performed at [facility], [address], [place], [place] [address]    Special Requests      BOTTLES DRAWN AEROBIC AND ANAEROBIC Blood Culture adequate volume Performed at [facility], [address] [place], [place] [address]    Culture      NO GROWTH 5 DAYS Performed at [facility], [address], [place], [place] [address]    Report Status 11/11/2022 FINAL   CBC with Differential/Platelet   Collection Time: 11/06/22  2:33 PM  Result Value Ref Range   WBC 7.9 4.0 - 10.5 K/uL   RBC 4.81 4.22 - 5.81 MIL/uL   Hemoglobin 15.3 13.0 - 17.0 g/dL   HCT 53.7 60.9 - 47.9 %   MCV 96.0 80.0 - 100.0 fL   MCH 31.8 26.0 - 34.0 pg   MCHC 33.1 30.0 - 36.0 g/dL   RDW 87.1 88.4 - 84.4 %   Platelets 171 150 - 400 K/uL   nRBC 0.0 0.0 - 0.2 %   Neutrophils Relative % 78 %   Neutro Abs 6.2 1.7 - 7.7 K/uL   Lymphocytes Relative 13 %   Lymphs Abs 1.0 0.7 - 4.0 K/uL   Monocytes Relative 5 %   Monocytes Absolute 0.4 0.1 - 1.0 K/uL   Eosinophils Relative 3 %   Eosinophils Absolute 0.3 0.0 -  0.5 K/uL   Basophils Relative 0 %   Basophils Absolute 0.0 0.0 - 0.1 K/uL   Immature Granulocytes 1 %   Abs Immature Granulocytes 0.04 0.00 - 0.07 K/uL  Comprehensive metabolic panel   Collection Time: 11/06/22  2:33 PM  Result Value Ref Range   Sodium 136 135 - 145 mmol/L   Potassium 3.4 (L) 3.5 - 5.1 mmol/L   Chloride 105 98 - 111 mmol/L   CO2 22 22 - 32 mmol/L   Glucose, Bld 165 (H) 70 - 99 mg/dL   BUN 20 8 - 23 mg/dL   Creatinine, Ser 9.17 0.61 - 1.24 mg/dL   Calcium 8.6 (L) 8.9 - 10.3 mg/dL   Total Protein 6.6 6.5 - 8.1 g/dL   Albumin 3.3 (L) 3.5 - 5.0 g/dL   AST 21 15 - 41 U/L   ALT 19 0 - 44 U/L   Alkaline Phosphatase 79 38 - 126 U/L   Total Bilirubin 1.1 0.3 - 1.2 mg/dL   GFR, Estimated >39 >39 mL/min   Anion gap 9 5 - 15  Magnesium   Collection Time: 11/06/22  2:33 PM  Result  Value Ref Range   Magnesium 2.2 1.7 - 2.4 mg/dL  Culture, blood (Routine X 2) w Reflex to ID Panel   Collection Time: 11/06/22  2:38 PM   Specimen: BLOOD LEFT HAND  Result Value Ref Range   Specimen Description      BLOOD LEFT HAND Performed at [facility], [address] [place], [place] [address]    Special Requests      BOTTLES DRAWN AEROBIC AND ANAEROBIC Blood Culture results may not be optimal due to an inadequate volume of blood received in culture bottles Performed at [facility], [address], [place], [place] [address]    Culture      NO GROWTH 5 DAYS Performed at [facility], [address], [place], [place] [address]    Report Status 11/11/2022 FINAL   MRSA Next Gen by PCR, Nasal   Collection Time: 11/07/22  6:31 AM   Specimen: Nasal Mucosa; Nasal Swab  Result Value Ref Range   MRSA by PCR Next Gen NOT DETECTED NOT DETECTED      Assessment & Plan:   Problem List Items Addressed This Visit       Other   Cough - Primary   Present for 3 days, started symptoms after coming back from cruise.  They will obtain flu/Covid  combo test and perform at home then notify provider of results, he is in time period for Covid treatment if present.  For now will send in 5 days of 40 MG Prednisone  and Tussionex for cough and sleep. Symptoms currently more viral upper respiratory infection in nature. Discussed that these usually run their course in 5-7 days and antibiotics don't work against viruses, but increase risk of side effects. Recommend: - Increased rest - Increasing Fluids - Acetaminophen  as needed for fever/pain.  - Salt water gargling, chloraseptic spray and throat lozenge - Mucinex.  - Saline sinus flushes or a neti pot.  - Humidifying the air.  - If ongoing or worsening by Monday next week alert provider and will send in abx at that time.          Follow up plan: Return if symptoms worsen or fail to improve.

## 2023-03-16 ENCOUNTER — Telehealth: Payer: Medicare HMO | Admitting: Pediatrics

## 2023-03-17 ENCOUNTER — Encounter: Payer: Self-pay | Admitting: Nurse Practitioner

## 2023-03-17 MED ORDER — AMOXICILLIN-POT CLAVULANATE 875-125 MG PO TABS: 1.0000 | ORAL_TABLET | Freq: Two times a day (BID) | ORAL | 0 refills | Status: AC

## 2023-03-17 NOTE — Addendum Note (Signed)
 Addended by: Nazareth Kirk T on: 03/17/2023 12:11 PM   Modules accepted: Orders

## 2023-03-24 ENCOUNTER — Encounter: Payer: Self-pay | Admitting: Family Medicine

## 2023-03-30 IMAGING — DX DG HIP (WITH OR WITHOUT PELVIS) 2-3V*R*
3 series · 3 of 3 positions shown · non-contrast
Comparison: Lumbar spine radiographs are dictated separately.

CLINICAL DATA: Right hip and leg pain without trauma

EXAM:
DG HIP (WITH OR WITHOUT PELVIS) 2-3V RIGHT

[pelvis ap]
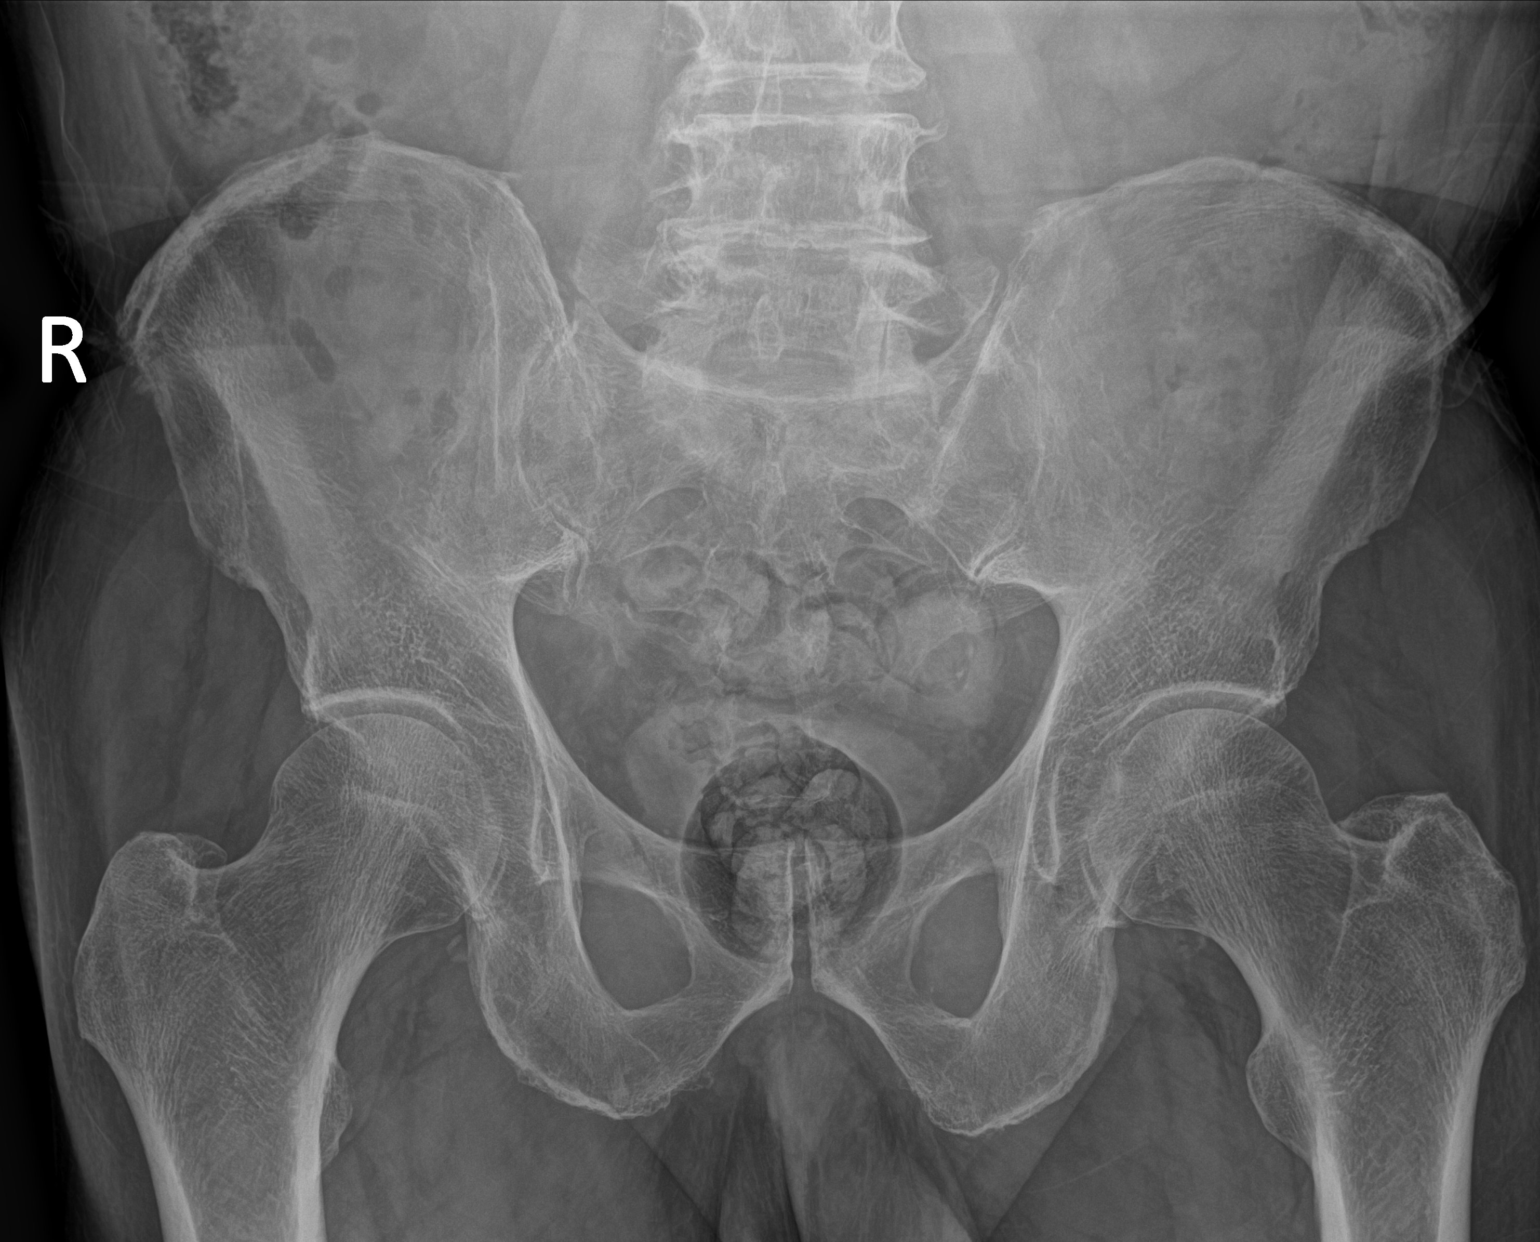

[hip ap]
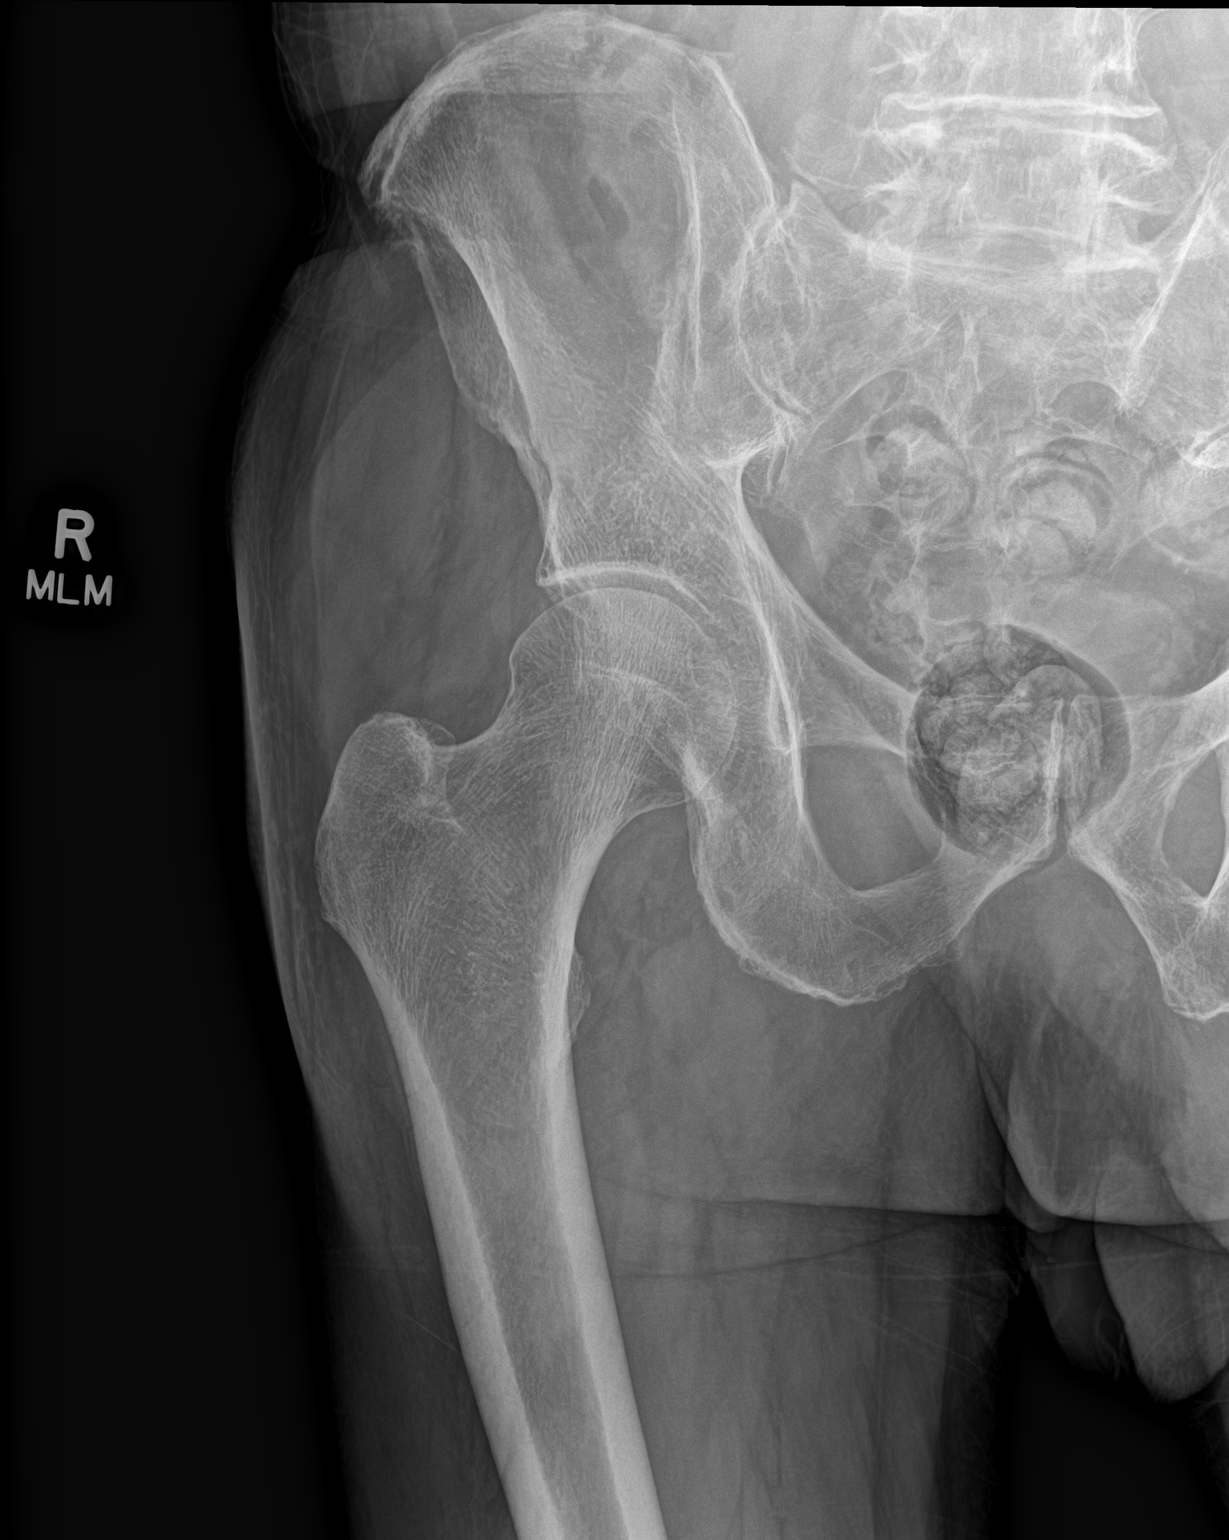

[hip lat]
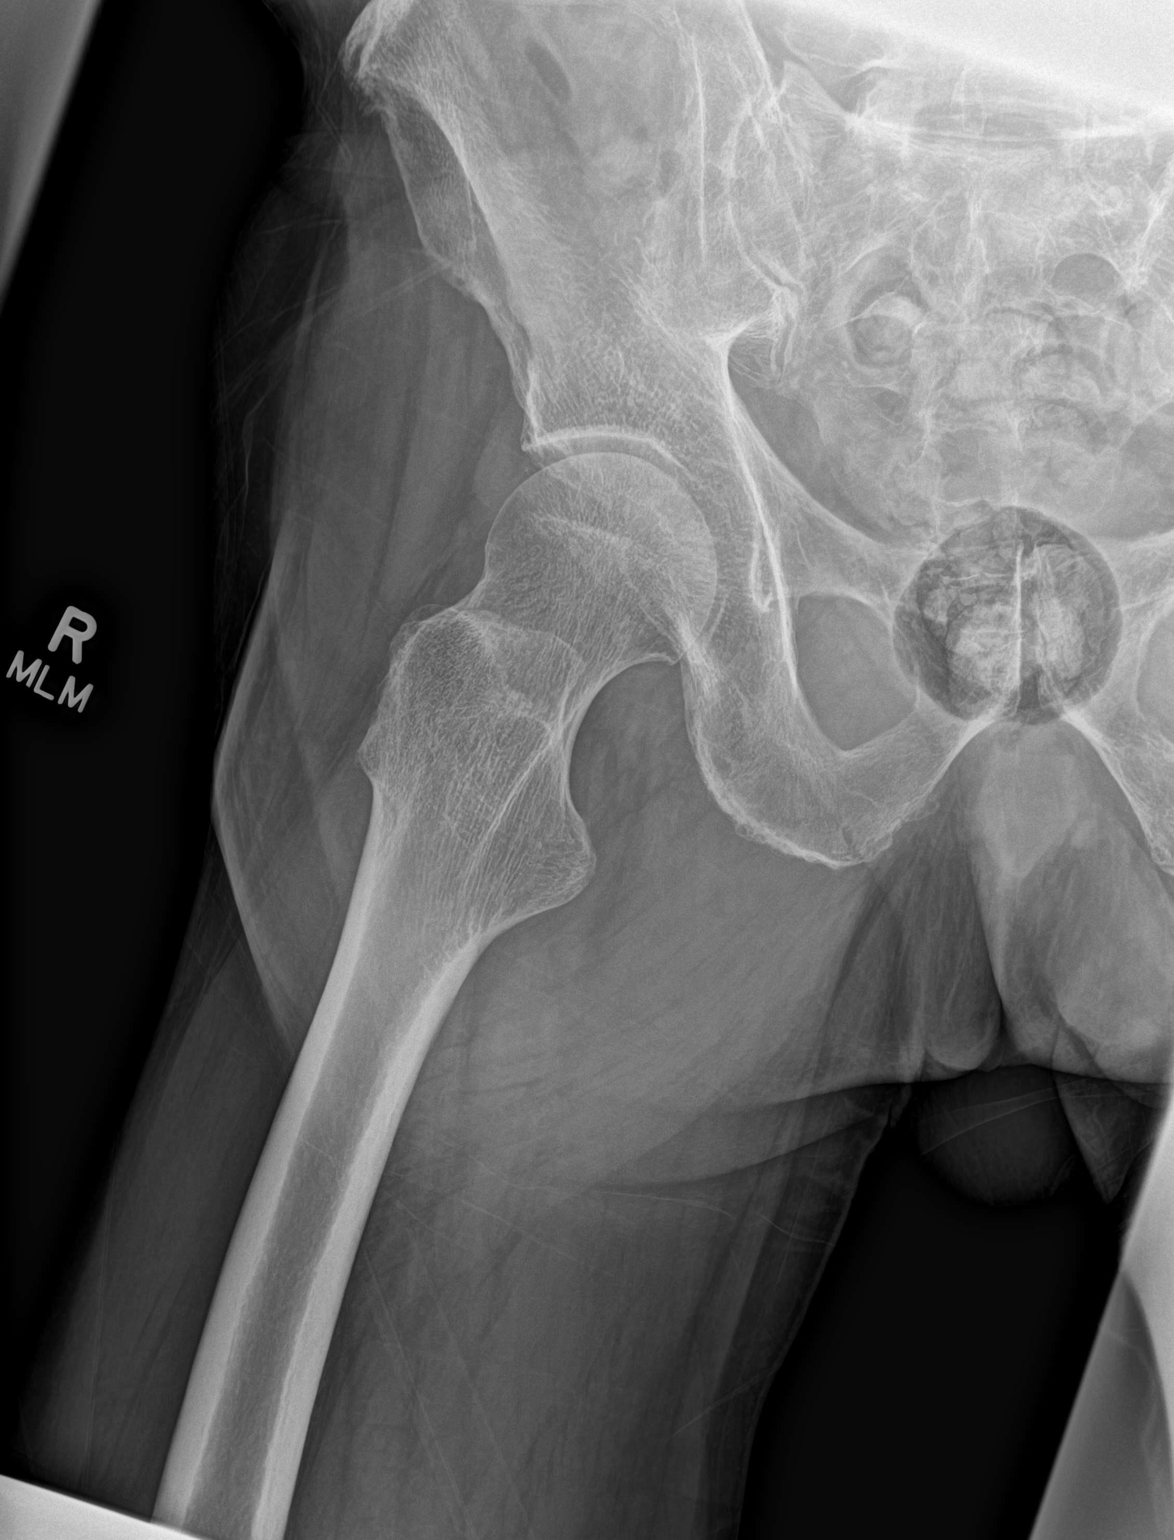

[3 of 3 positions shown; findings below may reference images not displayed]

FINDINGS: AP view of the pelvis and AP/frog leg views of the right hip.
Femoral heads are located. No acute fracture. Joint spaces
maintained.
IMPRESSION: No acute osseous abnormality.

## 2023-03-31 ENCOUNTER — Ambulatory Visit: Payer: Medicare HMO | Admitting: Family Medicine

## 2023-04-25 ENCOUNTER — Ambulatory Visit (INDEPENDENT_AMBULATORY_CARE_PROVIDER_SITE_OTHER): Payer: Medicare HMO | Admitting: Family Medicine

## 2023-04-25 ENCOUNTER — Encounter: Payer: Self-pay | Admitting: Family Medicine

## 2023-04-25 VITALS — BP 133/76 | HR 61 | Resp 16 | Wt 179.6 lb

## 2023-04-25 DIAGNOSIS — F3341 Major depressive disorder, recurrent, in partial remission: Secondary | ICD-10-CM

## 2023-04-25 DIAGNOSIS — R7301 Impaired fasting glucose: Secondary | ICD-10-CM

## 2023-04-25 DIAGNOSIS — E782 Mixed hyperlipidemia: Secondary | ICD-10-CM | POA: Diagnosis not present

## 2023-04-25 LAB — BAYER DCA HB A1C WAIVED: HB A1C (BAYER DCA - WAIVED): 6.2 % — ABNORMAL HIGH (ref 4.8–5.6)

## 2023-04-25 MED ORDER — SIMVASTATIN 40 MG PO TABS: 40.0000 mg | ORAL_TABLET | Freq: Every day | ORAL | 1 refills | Status: DC

## 2023-04-25 MED ORDER — TRAZODONE HCL 50 MG PO TABS: 50.0000 mg | ORAL_TABLET | Freq: Every evening | ORAL | 1 refills | Status: DC | PRN

## 2023-04-25 MED ORDER — BUPROPION HCL ER (XL) 300 MG PO TB24: ORAL_TABLET | ORAL | 1 refills | Status: DC

## 2023-04-25 MED ORDER — EZETIMIBE 10 MG PO TABS: 10.0000 mg | ORAL_TABLET | Freq: Every day | ORAL | 1 refills | Status: DC

## 2023-04-25 MED ORDER — MELOXICAM 15 MG PO TABS: 15.0000 mg | ORAL_TABLET | Freq: Every day | ORAL | 1 refills | Status: DC

## 2023-04-25 NOTE — Assessment & Plan Note (Signed)
 Under good control on current regimen. Continue current regimen. Continue to monitor. Call with any concerns. Refills given.

## 2023-04-25 NOTE — Assessment & Plan Note (Signed)
 Under good control on current regimen. Continue current regimen. Continue to monitor. Call with any concerns. Refills given. Labs drawn today.

## 2023-04-25 NOTE — Assessment & Plan Note (Signed)
 Rechecking labs today. Await results as needed.

## 2023-04-25 NOTE — Progress Notes (Signed)
 BP 133/76 (BP Location: Left Arm, Cuff Size: Large)   Pulse 61   Resp 16   Wt 179 lb 9.6 oz (81.5 kg)   SpO2 99%   BMI 26.07 kg/m    Subjective:    Patient ID: Wayne Robles, male    DOB: 1945/02/14, 78 y.o.   MRN: [id number]  HPI: Wayne Robles is a 78 y.o. male  Chief Complaint  Patient presents with   Hyperlipidemia   impaied fasting glucose   Depression    Has been good. Can't complain considering everything going on.    Impaired Fasting Glucose HbA1C:  Lab Results  Component Value Date   HGBA1C 6.2 (H) 10/26/2022   Duration of elevated blood sugar: chronic Polydipsia: no Polyuria: no Weight change: no Visual disturbance: no Glucose Monitoring: no Diabetic Education: Not Completed Family history of diabetes: yes  HYPERLIPIDEMIA Hyperlipidemia status: excellent compliance Satisfied with current treatment?  yes Side effects:  no Medication compliance: excellent compliance Past cholesterol meds: simvastatin, zetia Supplements: none Aspirin:  no The 10-year ASCVD risk score (Arnett DK, et al., 2019) is: 24.1%   Values used to calculate the score:     Age: 35 years     Sex: Male     Is Non-Hispanic African American: No     Diabetic: No     Tobacco smoker: No     Systolic Blood Pressure: 133 mmHg     Is BP treated: No     HDL Cholesterol: 45 mg/dL     Total Cholesterol: 135 mg/dL Chest pain:  no Coronary artery disease:  no  DEPRESSION Mood status: controlled Satisfied with current treatment?: yes Symptom severity: mild  Duration of current treatment : chronic Side effects: no Medication compliance: excellent compliance Psychotherapy/counseling: no  Previous psychiatric medications: wellbutrin Depressed mood: no Anxious mood: no Anhedonia: no Significant weight loss or gain: no Insomnia: no  Fatigue: no Feelings of worthlessness or guilt: no Impaired concentration/indecisiveness: no Suicidal ideations: no Hopelessness: no Crying  spells: no    10/26/2022    9:08 AM 04/04/2022    8:17 AM 02/25/2022    2:16 PM 12/10/2021    8:05 AM 06/09/2021    8:48 AM  Depression screen PHQ 2/9  Decreased Interest 0 0 0 0 0  Down, Depressed, Hopeless 0 0 1 0 1  PHQ - 2 Score 0 0 1 0 1  Altered sleeping 0 1 1 0 1  Tired, decreased energy 0 0 0 0 1  Change in appetite 0 0 0 0 1  Feeling bad or failure about yourself  1 1 0 0 1  Trouble concentrating 0 0 0 0 1  Moving slowly or fidgety/restless 0 0 0 0 0  Suicidal thoughts 0 1 0 0 1  PHQ-9 Score 1 3 2  0 7  Difficult doing work/chores Not difficult at all Not difficult at all Not difficult at all      Relevant past medical, surgical, family and social history reviewed and updated as indicated. Interim medical history since our last visit reviewed. Allergies and medications reviewed and updated.  Review of Systems  Constitutional: Negative.   Respiratory: Negative.    Cardiovascular: Negative.   Musculoskeletal: Negative.   Neurological: Negative.   Psychiatric/Behavioral: Negative.      Per HPI unless specifically indicated above     Objective:    BP 133/76 (BP Location: Left Arm, Cuff Size: Large)   Pulse 61   Resp 16  Wt 179 lb 9.6 oz (81.5 kg)   SpO2 99%   BMI 26.07 kg/m   Wt Readings from Last 3 Encounters:  04/25/23 179 lb 9.6 oz (81.5 kg)  10/26/22 184 lb 6.4 oz (83.6 kg)  04/04/22 186 lb 14.4 oz (84.8 kg)    Physical Exam Vitals and nursing note reviewed.  Constitutional:      General: He is not in acute distress.    Appearance: Normal appearance. He is not ill-appearing, toxic-appearing or diaphoretic.  HENT:     Head: Normocephalic and atraumatic.     Right Ear: External ear normal.     Left Ear: External ear normal.     Nose: Nose normal.     Mouth/Throat:     Mouth: Mucous membranes are moist.     Pharynx: Oropharynx is clear.  Eyes:     General: No scleral icterus.       Right eye: No discharge.        Left eye: No discharge.      Extraocular Movements: Extraocular movements intact.     Conjunctiva/sclera: Conjunctivae normal.     Pupils: Pupils are equal, round, and reactive to light.  Cardiovascular:     Rate and Rhythm: Normal rate and regular rhythm.     Pulses: Normal pulses.     Heart sounds: Normal heart sounds. No murmur heard.    No friction rub. No gallop.  Pulmonary:     Effort: Pulmonary effort is normal. No respiratory distress.     Breath sounds: Normal breath sounds. No stridor. No wheezing, rhonchi or rales.  Chest:     Chest wall: No tenderness.  Musculoskeletal:        General: Normal range of motion.     Cervical back: Normal range of motion and neck supple.  Skin:    General: Skin is warm and dry.     Capillary Refill: Capillary refill takes less than 2 seconds.     Coloration: Skin is not jaundiced or pale.     Findings: No bruising, erythema, lesion or rash.  Neurological:     General: No focal deficit present.     Mental Status: He is alert and oriented to person, place, and time. Mental status is at baseline.  Psychiatric:        Mood and Affect: Mood normal.        Behavior: Behavior normal.        Thought Content: Thought content normal.        Judgment: Judgment normal.     Results for orders placed or performed during the hospital encounter of 11/04/22  CBC with Differential   Collection Time: 11/04/22  9:54 AM  Result Value Ref Range   WBC 12.0 (H) 4.0 - 10.5 K/uL   RBC 5.04 4.22 - 5.81 MIL/uL   Hemoglobin 16.1 13.0 - 17.0 g/dL   HCT 16.1 09.6 - 04.5 %   MCV 93.7 80.0 - 100.0 fL   MCH 31.9 26.0 - 34.0 pg   MCHC 34.1 30.0 - 36.0 g/dL   RDW 40.9 81.1 - 91.4 %   Platelets 173 150 - 400 K/uL   nRBC 0.0 0.0 - 0.2 %   Neutrophils Relative % 83 %   Neutro Abs 9.9 (H) 1.7 - 7.7 K/uL   Lymphocytes Relative 9 %   Lymphs Abs 1.1 0.7 - 4.0 K/uL   Monocytes Relative 7 %   Monocytes Absolute 0.8 0.1 - 1.0 K/uL   Eosinophils Relative  1 %   Eosinophils Absolute 0.2 0.0 - 0.5  K/uL   Basophils Relative 0 %   Basophils Absolute 0.0 0.0 - 0.1 K/uL   Immature Granulocytes 0 %   Abs Immature Granulocytes 0.04 0.00 - 0.07 K/uL  Comprehensive metabolic panel   Collection Time: 11/04/22  9:54 AM  Result Value Ref Range   Sodium 135 135 - 145 mmol/L   Potassium 4.1 3.5 - 5.1 mmol/L   Chloride 102 98 - 111 mmol/L   CO2 24 22 - 32 mmol/L   Glucose, Bld 110 (H) 70 - 99 mg/dL   BUN 20 8 - 23 mg/dL   Creatinine, Ser 4.69 0.61 - 1.24 mg/dL   Calcium 9.2 8.9 - 62.9 mg/dL   Total Protein 7.0 6.5 - 8.1 g/dL   Albumin 4.2 3.5 - 5.0 g/dL   AST 21 15 - 41 U/L   ALT 22 0 - 44 U/L   Alkaline Phosphatase 86 38 - 126 U/L   Total Bilirubin 1.8 (H) 0.3 - 1.2 mg/dL   GFR, Estimated >52 >84 mL/min   Anion gap 9 5 - 15  Lipase, blood   Collection Time: 11/04/22  9:54 AM  Result Value Ref Range   Lipase 10 (L) 11 - 51 U/L  Basic metabolic panel   Collection Time: 11/05/22  7:54 AM  Result Value Ref Range   Sodium 136 135 - 145 mmol/L   Potassium 3.9 3.5 - 5.1 mmol/L   Chloride 104 98 - 111 mmol/L   CO2 21 (L) 22 - 32 mmol/L   Glucose, Bld 113 (H) 70 - 99 mg/dL   BUN 18 8 - 23 mg/dL   Creatinine, Ser 1.32 0.61 - 1.24 mg/dL   Calcium 8.6 (L) 8.9 - 10.3 mg/dL   GFR, Estimated >44 >01 mL/min   Anion gap 11 5 - 15  CBC   Collection Time: 11/05/22  7:54 AM  Result Value Ref Range   WBC 10.7 (H) 4.0 - 10.5 K/uL   RBC 4.75 4.22 - 5.81 MIL/uL   Hemoglobin 15.1 13.0 - 17.0 g/dL   HCT 02.7 25.3 - 66.4 %   MCV 97.1 80.0 - 100.0 fL   MCH 31.8 26.0 - 34.0 pg   MCHC 32.8 30.0 - 36.0 g/dL   RDW 40.3 47.4 - 25.9 %   Platelets 175 150 - 400 K/uL   nRBC 0.0 0.0 - 0.2 %  Culture, blood (Routine X 2) w Reflex to ID Panel   Collection Time: 11/06/22  2:33 PM   Specimen: BLOOD RIGHT ARM  Result Value Ref Range   Specimen Description      BLOOD RIGHT ARM Performed at [facility], [address], [place], [place] [address]    Special Requests      BOTTLES DRAWN  AEROBIC AND ANAEROBIC Blood Culture adequate volume Performed at [facility], [address], [place], [place] [address]    Culture      NO GROWTH 5 DAYS Performed at [facility], [address] [place], [place] [address]    Report Status 11/11/2022 FINAL   CBC with Differential/Platelet   Collection Time: 11/06/22  2:33 PM  Result Value Ref Range   WBC 7.9 4.0 - 10.5 K/uL   RBC 4.81 4.22 - 5.81 MIL/uL   Hemoglobin 15.3 13.0 - 17.0 g/dL   HCT 84.1 66.0 - 63.0 %   MCV 96.0 80.0 - 100.0 fL   MCH 31.8 26.0 -  34.0 pg   MCHC 33.1 30.0 - 36.0 g/dL   RDW 16.1 09.6 - 04.5 %   Platelets 171 150 - 400 K/uL   nRBC 0.0 0.0 - 0.2 %   Neutrophils Relative % 78 %   Neutro Abs 6.2 1.7 - 7.7 K/uL   Lymphocytes Relative 13 %   Lymphs Abs 1.0 0.7 - 4.0 K/uL   Monocytes Relative 5 %   Monocytes Absolute 0.4 0.1 - 1.0 K/uL   Eosinophils Relative 3 %   Eosinophils Absolute 0.3 0.0 - 0.5 K/uL   Basophils Relative 0 %   Basophils Absolute 0.0 0.0 - 0.1 K/uL   Immature Granulocytes 1 %   Abs Immature Granulocytes 0.04 0.00 - 0.07 K/uL  Comprehensive metabolic panel   Collection Time: 11/06/22  2:33 PM  Result Value Ref Range   Sodium 136 135 - 145 mmol/L   Potassium 3.4 (L) 3.5 - 5.1 mmol/L   Chloride 105 98 - 111 mmol/L   CO2 22 22 - 32 mmol/L   Glucose, Bld 165 (H) 70 - 99 mg/dL   BUN 20 8 - 23 mg/dL   Creatinine, Ser 4.09 0.61 - 1.24 mg/dL   Calcium 8.6 (L) 8.9 - 10.3 mg/dL   Total Protein 6.6 6.5 - 8.1 g/dL   Albumin 3.3 (L) 3.5 - 5.0 g/dL   AST 21 15 - 41 U/L   ALT 19 0 - 44 U/L   Alkaline Phosphatase 79 38 - 126 U/L   Total Bilirubin 1.1 0.3 - 1.2 mg/dL   GFR, Estimated >81 >19 mL/min   Anion gap 9 5 - 15  Magnesium   Collection Time: 11/06/22  2:33 PM  Result Value Ref Range   Magnesium 2.2 1.7 - 2.4 mg/dL  Culture, blood (Routine X 2) w Reflex to ID Panel   Collection Time: 11/06/22  2:38 PM   Specimen: BLOOD LEFT HAND  Result Value Ref Range    Specimen Description      BLOOD LEFT HAND Performed at [facility], [address], [place], [place] [address]    Special Requests      BOTTLES DRAWN AEROBIC AND ANAEROBIC Blood Culture results may not be optimal due to an inadequate volume of blood received in culture bottles Performed at [facility], [address] [place], [place] [address]    Culture      NO GROWTH 5 DAYS Performed at [facility], [address] [place], [place] [address]    Report Status 11/11/2022 FINAL   MRSA Next Gen by PCR, Nasal   Collection Time: 11/07/22  6:31 AM   Specimen: Nasal Mucosa; Nasal Swab  Result Value Ref Range   MRSA by PCR Next Gen NOT DETECTED NOT DETECTED      Assessment & Plan:   Problem List Items Addressed This Visit       Endocrine   IFG (impaired fasting glucose)   Rechecking labs today. Await results as needed.       Relevant Orders   Bayer DCA Hb A1c Waived   CBC with Differential/Platelet   Comprehensive metabolic panel     Other   Depression   Under good control on current regimen. Continue current regimen. Continue to monitor. Call with any concerns. Refills given.        Relevant Medications   buPROPion (WELLBUTRIN XL) 300 MG 24 hr tablet   traZODone (DESYREL) 50 MG tablet   Other Relevant Orders  CBC with Differential/Platelet   Hyperlipidemia - Primary   Under good control on current regimen. Continue current regimen. Continue to monitor. Call with any concerns. Refills given. Labs drawn today.       Relevant Medications   ezetimibe (ZETIA) 10 MG tablet   simvastatin (ZOCOR) 40 MG tablet   Other Relevant Orders   CBC with Differential/Platelet   Lipid Panel w/o Chol/HDL Ratio   Comprehensive metabolic panel     Follow up plan: Return in about 6 months (around 10/26/2023) for physical.

## 2023-04-26 LAB — COMPREHENSIVE METABOLIC PANEL
ALT: 26 IU/L (ref 0–44)
AST: 29 IU/L (ref 0–40)
Albumin: 4.6 g/dL (ref 3.8–4.8)
Alkaline Phosphatase: 119 IU/L (ref 44–121)
BUN/Creatinine Ratio: 17 (ref 10–24)
BUN: 16 mg/dL (ref 8–27)
Bilirubin Total: 0.9 mg/dL (ref 0.0–1.2)
CO2: 23 mmol/L (ref 20–29)
Calcium: 9.6 mg/dL (ref 8.6–10.2)
Chloride: 102 mmol/L (ref 96–106)
Creatinine, Ser: 0.95 mg/dL (ref 0.76–1.27)
Globulin, Total: 2.2 g/dL (ref 1.5–4.5)
Glucose: 94 mg/dL (ref 70–99)
Potassium: 4.3 mmol/L (ref 3.5–5.2)
Sodium: 141 mmol/L (ref 134–144)
Total Protein: 6.8 g/dL (ref 6.0–8.5)
eGFR: 83 mL/min/{1.73_m2} (ref >59)

## 2023-04-26 LAB — CBC WITH DIFFERENTIAL/PLATELET
Basophils Absolute: 0 10*3/uL (ref 0.0–0.2)
Basos: 0 %
EOS (ABSOLUTE): 0.1 10*3/uL (ref 0.0–0.4)
Eos: 1 %
Hematocrit: 48.2 % (ref 37.5–51.0)
Hemoglobin: 16.2 g/dL (ref 13.0–17.7)
Immature Grans (Abs): 0 10*3/uL (ref 0.0–0.1)
Immature Granulocytes: 0 %
Lymphocytes Absolute: 1.6 10*3/uL (ref 0.7–3.1)
Lymphs: 23 %
MCH: 32.7 pg (ref 26.6–33.0)
MCHC: 33.6 g/dL (ref 31.5–35.7)
MCV: 97 fL (ref 79–97)
Monocytes Absolute: 0.5 10*3/uL (ref 0.1–0.9)
Monocytes: 8 %
Neutrophils Absolute: 4.6 10*3/uL (ref 1.4–7.0)
Neutrophils: 68 %
Platelets: 206 10*3/uL (ref 150–450)
RBC: 4.95 x10E6/uL (ref 4.14–5.80)
RDW: 13 % (ref 11.6–15.4)
WBC: 6.8 10*3/uL (ref 3.4–10.8)

## 2023-04-26 LAB — LIPID PANEL W/O CHOL/HDL RATIO
Cholesterol, Total: 139 mg/dL (ref 100–199)
HDL: 48 mg/dL (ref >39)
LDL Chol Calc (NIH): 63 mg/dL (ref 0–99)
Triglycerides: 166 mg/dL — ABNORMAL HIGH (ref 0–149)
VLDL Cholesterol Cal: 28 mg/dL (ref 5–40)

## 2023-04-28 ENCOUNTER — Encounter: Payer: Self-pay | Admitting: Family Medicine

## 2023-07-14 ENCOUNTER — Telehealth: Admitting: Family Medicine

## 2023-07-14 DIAGNOSIS — S81812A Laceration without foreign body, left lower leg, initial encounter: Secondary | ICD-10-CM | POA: Diagnosis not present

## 2023-07-14 DIAGNOSIS — L03116 Cellulitis of left lower limb: Secondary | ICD-10-CM | POA: Diagnosis not present

## 2023-07-14 MED ORDER — CEPHALEXIN 500 MG PO CAPS: 500.0000 mg | ORAL_CAPSULE | Freq: Three times a day (TID) | ORAL | 0 refills | Status: AC

## 2023-07-14 NOTE — Patient Instructions (Signed)

## 2023-07-14 NOTE — Progress Notes (Signed)
 Virtual Visit Consent   Wayne Robles, you are scheduled for a virtual visit with a [facility] provider today. Just as with appointments in the office, your consent must be obtained to participate. Your consent will be active for this visit and any virtual visit you may have with one of our providers in the next 365 days. If you have a MyChart account, a copy of this consent can be sent to you electronically.  As this is a virtual visit, video technology does not allow for your provider to perform a traditional examination. This may limit your provider's ability to fully assess your condition. If your provider identifies any concerns that need to be evaluated in person or the need to arrange testing (such as labs, EKG, etc.), we will make arrangements to do so. Although advances in technology are sophisticated, we cannot ensure that it will always work on either your end or our end. If the connection with a video visit is poor, the visit may have to be switched to a telephone visit. With either a video or telephone visit, we are not always able to ensure that we have a secure connection.  By engaging in this virtual visit, you consent to the provision of healthcare and authorize for your insurance to be billed (if applicable) for the services provided during this visit. Depending on your insurance coverage, you may receive a charge related to this service.  I need to obtain your verbal consent now. Are you willing to proceed with your visit today? Wayne Robles has provided verbal consent on 07/14/2023 for a virtual visit (video or telephone). Albertha Huger, FNP  Date: 07/14/2023 8:35 AM   Virtual Visit via Video Note   I, Albertha Huger, connected with  Wayne Robles  ([id number], 23-Feb-1945) on 07/14/23 at  8:30 AM EDT by a video-enabled telemedicine application and verified that I am speaking with the correct person using two identifiers.  Location: Patient: Virtual Visit Location Patient:  Home Provider: Virtual Visit Location Provider: Home Office   I discussed the limitations of evaluation and management by telemedicine and the availability of in person appointments. The patient expressed understanding and agreed to proceed.    History of Present Illness: Wayne Robles is a 78 y.o. who identifies as a male who was assigned male at birth, and is being seen today for laceration to left lower extremity. He dropped a piece of wood on it 3 weeks ago and it is now red and swollen with pain. No fever.   HPI: HPI  Problems:  Patient Active Problem List   Diagnosis Date Noted   Erectile dysfunction 02/17/2023   Aortic atherosclerosis (HCC) 10/26/2022   Asthma 02/25/2022   IFG (impaired fasting glucose) 08/11/2020   Depression 01/19/2013   Hyperlipidemia 01/19/2013    Allergies: No Known Allergies Medications:  Current Outpatient Medications:    albuterol  (VENTOLIN  HFA) 108 (90 Base) MCG/ACT inhaler, Inhale 1-2 puffs into the lungs every 6 (six) hours as needed for wheezing or shortness of breath., Disp: 8 each, Rfl: 6   buPROPion  (WELLBUTRIN  XL) 300 MG 24 hr tablet, TAKE 1 TABLET(300 MG) BY MOUTH DAILY, Disp: 90 tablet, Rfl: 1   ezetimibe  (ZETIA ) 10 MG tablet, Take 1 tablet (10 mg total) by mouth daily., Disp: 90 tablet, Rfl: 1   meloxicam  (MOBIC ) 15 MG tablet, Take 1 tablet (15 mg total) by mouth daily., Disp: 90 tablet, Rfl: 1   Multiple Vitamin (MULTIVITAMIN) tablet, Take 1 tablet by  mouth daily with breakfast., Disp: , Rfl:    sildenafil  (VIAGRA ) 100 MG tablet, Take 0.5-1 tablets (50-100 mg total) by mouth daily as needed for erectile dysfunction., Disp: 5 tablet, Rfl: 11   simvastatin  (ZOCOR ) 40 MG tablet, Take 1 tablet (40 mg total) by mouth daily., Disp: 90 tablet, Rfl: 1   traZODone  (DESYREL ) 50 MG tablet, Take 1 tablet (50 mg total) by mouth at bedtime as needed. for sleep, Disp: 90 tablet, Rfl: 1   TYLENOL  500 MG tablet, Take 500 mg by mouth every 6 (six) hours as  needed for mild pain or headache., Disp: , Rfl:   Observations/Objective: Patient is well-developed, well-nourished in no acute distress.  Resting comfortably  at home.  Head is normocephalic, atraumatic.  No labored breathing.  Speech is clear and coherent with logical content.  Patient is alert and oriented at baseline.    Assessment and Plan: 1. Left leg cellulitis (Primary)  2. Laceration of left lower extremity, initial encounter  Keep area clean and dry, meds as directed, UC if sx persist or worsen.  Follow Up Instructions: I discussed the assessment and treatment plan with the patient. The patient was provided an opportunity to ask questions and all were answered. The patient agreed with the plan and demonstrated an understanding of the instructions.  A copy of instructions were sent to the patient via MyChart unless otherwise noted below.     The patient was advised to call back or seek an in-person evaluation if the symptoms worsen or if the condition fails to improve as anticipated.    Arby Dahir, FNP

## 2023-07-17 ENCOUNTER — Ambulatory Visit: Admitting: Nurse Practitioner

## 2023-07-24 ENCOUNTER — Other Ambulatory Visit: Payer: Self-pay

## 2023-07-24 ENCOUNTER — Emergency Department (HOSPITAL_BASED_OUTPATIENT_CLINIC_OR_DEPARTMENT_OTHER)
Admission: EM | Admit: 2023-07-24 | Discharge: 2023-07-24 | Disposition: A | Discharge status: Home / Self Care | Attending: Emergency Medicine | Admitting: Emergency Medicine

## 2023-07-24 ENCOUNTER — Ambulatory Visit: Payer: Self-pay

## 2023-07-24 ENCOUNTER — Encounter (HOSPITAL_BASED_OUTPATIENT_CLINIC_OR_DEPARTMENT_OTHER): Payer: Self-pay | Admitting: Emergency Medicine

## 2023-07-24 ENCOUNTER — Emergency Department (HOSPITAL_BASED_OUTPATIENT_CLINIC_OR_DEPARTMENT_OTHER)

## 2023-07-24 ENCOUNTER — Other Ambulatory Visit (HOSPITAL_BASED_OUTPATIENT_CLINIC_OR_DEPARTMENT_OTHER): Payer: Self-pay

## 2023-07-24 DIAGNOSIS — N5089 Other specified disorders of the male genital organs: Secondary | ICD-10-CM | POA: Diagnosis not present

## 2023-07-24 LAB — URINALYSIS, ROUTINE W REFLEX MICROSCOPIC
Bilirubin Urine: NEGATIVE
Glucose, UA: NEGATIVE mg/dL
Hgb urine dipstick: NEGATIVE
Ketones, ur: NEGATIVE mg/dL
Leukocytes,Ua: NEGATIVE
Nitrite: NEGATIVE
Protein, ur: NEGATIVE mg/dL
Specific Gravity, Urine: 1.018 (ref 1.005–1.030)
pH: 7 (ref 5.0–8.0)

## 2023-07-24 MED ORDER — TRIAMCINOLONE ACETONIDE 0.1 % EX CREA
1.0000 | TOPICAL_CREAM | Freq: Two times a day (BID) | CUTANEOUS | 0 refills | Status: DC
  Filled 2023-07-24: qty 30, 15d supply, fill #0

## 2023-07-24 NOTE — ED Triage Notes (Addendum)
 Scrotum itching and swelling x 5 days. Denies pain. Sent from [facility] for possible US .

## 2023-07-24 NOTE — Telephone Encounter (Signed)
 Called [facility] and spoke with [place] to inquire about adding pt to schedule today: was informed no openings with any provider.  Nurse then referred pt to urgent care for evaluation and treatment: understanding verbalized.

## 2023-07-24 NOTE — ED Provider Notes (Signed)
 [place] EMERGENCY DEPARTMENT AT [facility] Provider Note   CSN: [id number] Arrival date & time: 07/24/23  1038     Patient presents with: Groin Swelling   Wayne Robles is a 78 y.o. male.   Patient presents to the emergency department for evaluation of scrotal swelling and itching over the past 5 days.  No significant pain.  No new skin exposures or contacts that the patient can think of.  He tried applying Benadryl topically, but this burned.  He has been taking oral Benadryl which has just helped him rest.  No dysuria, hematuria, increased frequency or urgency.  No abdominal pain.  No injuries.       Prior to Admission medications   Medication Sig Start Date End Date Taking? Authorizing Provider  albuterol  (VENTOLIN  HFA) 108 (90 Base) MCG/ACT inhaler Inhale 1-2 puffs into the lungs every 6 (six) hours as needed for wheezing or shortness of breath. 10/26/22   Johnson, Megan P, DO  buPROPion  (WELLBUTRIN  XL) 300 MG 24 hr tablet TAKE 1 TABLET(300 MG) BY MOUTH DAILY 04/25/23   Johnson, Megan P, DO  ezetimibe  (ZETIA ) 10 MG tablet Take 1 tablet (10 mg total) by mouth daily. 04/25/23   Johnson, Megan P, DO  meloxicam  (MOBIC ) 15 MG tablet Take 1 tablet (15 mg total) by mouth daily. 04/25/23   Terre Ferri P, DO  Multiple Vitamin (MULTIVITAMIN) tablet Take 1 tablet by mouth daily with breakfast.    [provider]  sildenafil  (VIAGRA ) 100 MG tablet Take 0.5-1 tablets (50-100 mg total) by mouth daily as needed for erectile dysfunction. 02/17/23   Johnson, Megan P, DO  simvastatin  (ZOCOR ) 40 MG tablet Take 1 tablet (40 mg total) by mouth daily. 04/25/23   Johnson, Megan P, DO  traZODone  (DESYREL ) 50 MG tablet Take 1 tablet (50 mg total) by mouth at bedtime as needed. for sleep 04/25/23   Terre Ferri P, DO  TYLENOL  500 MG tablet Take 500 mg by mouth every 6 (six) hours as needed for mild pain or headache.    [provider]    Allergies: Patient has no known  allergies.    Review of Systems  Updated Vital Signs BP 132/85 (BP Location: Right Arm)   Pulse 66   Temp 98.5 F (36.9 C) (Oral)   Resp 16   SpO2 100%   Physical Exam Vitals and nursing note reviewed. Exam conducted with a chaperone present.  Constitutional:      Appearance: He is well-developed.  HENT:     Head: Normocephalic and atraumatic.   Eyes:     Conjunctiva/sclera: Conjunctivae normal.   Pulmonary:     Effort: No respiratory distress.  Genitourinary:    Penis: Normal.      Testes:        Right: Mass, tenderness or swelling not present.        Left: Mass, tenderness or swelling not present.     Epididymis:     Right: No tenderness.     Left: No tenderness.     Comments: Mild erythema of the scrotum without overt cellulitis.  Mild scrotal wall thickening suspected.  No tenderness.  No significant swelling noted of the testes.  Musculoskeletal:     Cervical back: Normal range of motion and neck supple.   Skin:    General: Skin is warm and dry.   Neurological:     Mental Status: He is alert.    ED Course  Patient seen and examined. History  obtained directly from patient and wife at bedside.  Exam performed.  Labs/EKG: Ordered UA  Imaging: Ordered scrotal ultrasound  Medications/Fluids: None ordered  Most recent vital signs reviewed and are as follows: BP 132/85 (BP Location: Right Arm)   Pulse 66   Temp 98.5 F (36.9 C) (Oral)   Resp 16   SpO2 100%   Initial impression: Likely superficial dermatitis of the scrotal wall, do not suspect significant infection or abscess at this time.  Certainly no signs of Fournier's gangrene.  No significant tenderness.  Will obtain ultrasound to evaluate for any structural abnormality.  If negative, may trial some topical steroid.   1:58 PM Reassessment performed. Patient appears stable.  Imaging results reviewed: Scrotal ultrasound, was negative  Reviewed pertinent lab work and imaging with patient at bedside.  Questions answered.   Most current vital signs reviewed and are as follows: BP (!) 144/68   Pulse (!) 54   Temp 98.5 F (36.9 C) (Oral)   Resp 16   SpO2 96%   Plan: Discharge to home.   Prescriptions written for: Triamcinolone  cream  Other home care instructions discussed: Keep area dry, do not apply other medications  ED return instructions discussed: New or worsening pain, swelling  Follow-up instructions discussed: Patient encouraged to follow-up with their PCP 1-2 weeks if not improving.    (all labs ordered are listed, but only abnormal results are displayed) Labs Reviewed  URINALYSIS, ROUTINE W REFLEX MICROSCOPIC    EKG: None  Radiology: No results found.   Procedures   Medications Ordered in the ED - No data to display                                  Medical Decision Making Amount and/or Complexity of Data Reviewed Labs: ordered. Radiology: ordered.  Risk Prescription drug management.   Patient with scrotal swelling and itching.  Ultrasound was negative.  Urine is clear.  This does not appear to be related to orchitis, epididymitis, hernia.  Does not appear to be edematous such as with congestive heart failure.  I think that his symptoms are more related to the skin at this time.  Appears to be inflammatory/allergic.  Do not suspect cellulitis or Fournier's gangrene.  Will trial topical steroid, encouraged to use less than 2 weeks.  Encouraged outpatient follow-up if not improving.     Final diagnoses:  Scrotal swelling    ED Discharge Orders          Ordered    triamcinolone  cream (KENALOG ) 0.1 %  2 times daily        07/24/23 1355               Lyna Sandhoff, PA-C 07/24/23 1401    Scarlette Currier, MD 07/25/23 1813

## 2023-07-24 NOTE — Discharge Instructions (Signed)
 Please read and follow all provided instructions.  Your diagnoses today include:  1. Scrotal swelling     Tests performed today include: Urine test: No signs of urinary infection Ultrasound: Normal-appearing testicles, no signs of infection or inflammation Vital signs. See below for your results today.   Medications prescribed:  Triamcinolone  (Kenalog ) cream - topical steroid medication for skin reaction, do not use for more than 2 weeks.  Take any prescribed medications only as directed.  Home care instructions:  Follow any educational materials contained in this packet.  BE VERY CAREFUL not to take multiple medicines containing Tylenol  (also called acetaminophen ). Doing so can lead to an overdose which can damage your liver and cause liver failure and possibly death.   Follow-up instructions: Please follow-up with your primary care provider in the next 7 days for further evaluation of your symptoms.   Return instructions:  Please return to the Emergency Department if you experience worsening symptoms.  Please return if you have any other emergent concerns.  Additional Information:  Your vital signs today were: BP (!) 144/68   Pulse (!) 54   Temp 98.5 F (36.9 C) (Oral)   Resp 16   SpO2 96%  If your blood pressure (BP) was elevated above 135/85 this visit, please have this repeated by your doctor within one month. --------------

## 2023-07-24 NOTE — Telephone Encounter (Signed)
 FYI Only or Action Required?: FYI only for provider  Patient was last seen in primary care on 07/14/2023 by Blair, Diane W, FNP. Called Nurse Triage reporting Pain. Symptoms began Wednesday. Interventions attempted: OTC medications: benadryl. Symptoms are: gradually worsening.  Triage Disposition: See HCP Within 4 Hours (Or PCP Triage)  Patient/caregiver understands and will follow disposition?: Yes   Copied from CRM [id number]. Topic: Clinical - Red Word Triage >> Jul 24, 2023  8:02 AM Wayne Robles wrote: Patient called in with red word: Pain scrotom is swollen.  Englargd balls and itchy. Reason for Disposition  Scrotum looks infected (e.g., draining sore, ulcer, red rash)  Answer Assessment - Initial Assessment Questions 1. LOCATION and RADIATION: Where is the pain located?      scrotum 2. QUALITY: What does the pain feel like?  (e.g., sharp, dull, aching, burning)     Painful-severe 3. SEVERITY: How bad is the pain?  (Scale 1-10; or mild, moderate, severe)   - MILD (1-3): doesn't interfere with normal activities    - MODERATE (4-7): interferes with normal activities (e.g., work or school) or awakens from sleep   - SEVERE (8-10): excruciating pain, unable to do any normal activities, difficulty walking     severe 4. ONSET: When did the pain start?     Wednesday or Thursday 5. PATTERN: Does it come and go, or has it been constant since it started?     constant 6. SCROTAL APPEARANCE: What does the scrotum look like? Is there any swelling or redness?      Red, swelling 7. HERNIA: Has a doctor ever told you that you have a hernia?     N/a 8. OTHER SYMPTOMS: Do you have any other symptoms? (e.g., abdomen pain, difficulty passing urine, fever, vomiting)     Itchy, swollen, red, painful- severe scrotum pain  Wife stated pt has just completed Abx for leg wound: not sure if this Abx caused this reaction.  Protocols used: Scrotum Pain-A-AH

## 2023-07-25 ENCOUNTER — Ambulatory Visit (INDEPENDENT_AMBULATORY_CARE_PROVIDER_SITE_OTHER): Admitting: Family Medicine

## 2023-07-25 VITALS — BP 146/88 | HR 66 | Ht 69.6 in | Wt 174.8 lb

## 2023-07-25 DIAGNOSIS — M7522 Bicipital tendinitis, left shoulder: Secondary | ICD-10-CM

## 2023-07-25 MED ORDER — DICLOFENAC SODIUM 1 % EX GEL: 4.0000 g | Freq: Four times a day (QID) | CUTANEOUS | 3 refills | Status: AC

## 2023-07-25 NOTE — Progress Notes (Signed)
 BP (!) 146/88 (BP Location: Left Arm, Patient Position: Sitting, Cuff Size: Normal)   Pulse 66   Ht 5' 9.6 (1.768 m)   Wt 174 lb 12.8 oz (79.3 kg)   SpO2 94%   BMI 25.37 kg/m    Subjective:    Patient ID: Wayne Robles, male    DOB: 03-Apr-1945, 78 y.o.   MRN: [id number]  HPI: Wayne Robles is a 78 y.o. male  Chief Complaint  Patient presents with   bicep pain    Pt complains of bicep pain started about 6 months ago. Progressively getting worse. Causing weakness.    ARM PAIN- notes that his muscle slipped he has been having some pain in his L bicep Duration: 6 months Location: distal biceps Mechanism of injury: unknown Onset: sudden Severity: moderate  Quality:  pulling  Frequency: with lifting Radiation: no Aggravating factors: lifting  Alleviating factors: rest  Status: stable Treatments attempted: nothing  Relief with NSAIDs?:  No NSAIDs Taken Swelling: no Redness: no  Warmth: no Trauma: no Chest pain: no  Shortness of breath: no  Fever: no Decreased sensation: no Paresthesias: no Weakness: no  Relevant past medical, surgical, family and social history reviewed and updated as indicated. Interim medical history since our last visit reviewed. Allergies and medications reviewed and updated.  Review of Systems  Constitutional: Negative.   Respiratory: Negative.    Cardiovascular: Negative.   Musculoskeletal:  Positive for myalgias. Negative for arthralgias, back pain, gait problem, joint swelling, neck pain and neck stiffness.  Psychiatric/Behavioral: Negative.      Per HPI unless specifically indicated above     Objective:    BP (!) 146/88 (BP Location: Left Arm, Patient Position: Sitting, Cuff Size: Normal)   Pulse 66   Ht 5' 9.6 (1.768 m)   Wt 174 lb 12.8 oz (79.3 kg)   SpO2 94%   BMI 25.37 kg/m   Wt Readings from Last 3 Encounters:  07/25/23 174 lb 12.8 oz (79.3 kg)  04/25/23 179 lb 9.6 oz (81.5 kg)  10/26/22 184 lb 6.4 oz (83.6 kg)     Physical Exam Vitals and nursing note reviewed.  Constitutional:      General: He is not in acute distress.    Appearance: Normal appearance. He is well-developed.  HENT:     Head: Normocephalic and atraumatic.     Right Ear: Hearing and external ear normal.     Left Ear: Hearing and external ear normal.     Nose: Nose normal.     Mouth/Throat:     Mouth: Mucous membranes are moist.     Pharynx: Oropharynx is clear.   Eyes:     General: Lids are normal. No scleral icterus.       Right eye: No discharge.        Left eye: No discharge.     Conjunctiva/sclera: Conjunctivae normal.   Pulmonary:     Effort: Pulmonary effort is normal. No respiratory distress.   Musculoskeletal:        General: Tenderness (mild tenderness along distal biceps tendon on the L, no lump or swelling) present. No swelling, deformity or signs of injury. Normal range of motion.     Right lower leg: No edema.     Left lower leg: No edema.   Skin:    Coloration: Skin is not jaundiced or pale.     Findings: No bruising, erythema, lesion or rash.   Neurological:     Mental Status: He  is alert. Mental status is at baseline. He is disoriented.   Psychiatric:        Mood and Affect: Mood normal.        Speech: Speech normal.        Behavior: Behavior normal.        Thought Content: Thought content normal.        Judgment: Judgment normal.     Results for orders placed or performed during the hospital encounter of 07/24/23  Urinalysis, Routine w reflex microscopic -Urine, Clean Catch   Collection Time: 07/24/23 12:10 PM  Result Value Ref Range   Color, Urine YELLOW YELLOW   APPearance CLEAR CLEAR   Specific Gravity, Urine 1.018 1.005 - 1.030   pH 7.0 5.0 - 8.0   Glucose, UA NEGATIVE NEGATIVE mg/dL   Hgb urine dipstick NEGATIVE NEGATIVE   Bilirubin Urine NEGATIVE NEGATIVE   Ketones, ur NEGATIVE NEGATIVE mg/dL   Protein, ur NEGATIVE NEGATIVE mg/dL   Nitrite NEGATIVE NEGATIVE   Leukocytes,Ua  NEGATIVE NEGATIVE      Assessment & Plan:   Problem List Items Addressed This Visit   None Visit Diagnoses       Biceps tendinitis of left upper extremity    -  Primary   Will treat with PRN voltaren and stretches. Call with any concerns or if not getting better.        Follow up plan: Return for As scheduled.

## 2023-08-28 ENCOUNTER — Ambulatory Visit: Admitting: Family Medicine

## 2023-10-06 ENCOUNTER — Ambulatory Visit: Admitting: Family Medicine

## 2023-10-31 ENCOUNTER — Encounter: Payer: Self-pay | Admitting: Family Medicine

## 2023-10-31 ENCOUNTER — Ambulatory Visit: Admitting: Family Medicine

## 2023-10-31 VITALS — BP 125/81 | HR 47 | Temp 97.4°F | Ht 69.0 in | Wt 168.0 lb

## 2023-10-31 DIAGNOSIS — E782 Mixed hyperlipidemia: Secondary | ICD-10-CM | POA: Diagnosis not present

## 2023-10-31 DIAGNOSIS — R634 Abnormal weight loss: Secondary | ICD-10-CM | POA: Diagnosis not present

## 2023-10-31 DIAGNOSIS — R7301 Impaired fasting glucose: Secondary | ICD-10-CM

## 2023-10-31 DIAGNOSIS — Z0000 Encounter for general adult medical examination without abnormal findings: Secondary | ICD-10-CM | POA: Diagnosis not present

## 2023-10-31 DIAGNOSIS — F3341 Major depressive disorder, recurrent, in partial remission: Secondary | ICD-10-CM | POA: Diagnosis not present

## 2023-10-31 DIAGNOSIS — Z23 Encounter for immunization: Secondary | ICD-10-CM | POA: Diagnosis not present

## 2023-10-31 DIAGNOSIS — M65342 Trigger finger, left ring finger: Secondary | ICD-10-CM | POA: Diagnosis not present

## 2023-10-31 DIAGNOSIS — R3911 Hesitancy of micturition: Secondary | ICD-10-CM

## 2023-10-31 DIAGNOSIS — I700 Atherosclerosis of aorta: Secondary | ICD-10-CM

## 2023-10-31 LAB — MICROALBUMIN, URINE WAIVED
Creatinine, Urine Waived: 200 mg/dL (ref 10–300)
Microalb, Ur Waived: 30 mg/L — ABNORMAL HIGH (ref 0–19)
Microalb/Creat Ratio: <30 mg/g

## 2023-10-31 LAB — BAYER DCA HB A1C WAIVED: HB A1C (BAYER DCA - WAIVED): 5.6 % (ref 4.8–5.6)

## 2023-10-31 MED ORDER — EZETIMIBE 10 MG PO TABS: 10.0000 mg | ORAL_TABLET | Freq: Every day | ORAL | 1 refills | Status: AC

## 2023-10-31 MED ORDER — BUPROPION HCL ER (XL) 300 MG PO TB24: ORAL_TABLET | ORAL | 1 refills | Status: AC

## 2023-10-31 MED ORDER — SILDENAFIL CITRATE 100 MG PO TABS: 50.0000 mg | ORAL_TABLET | Freq: Every day | ORAL | 11 refills | Status: AC | PRN

## 2023-10-31 MED ORDER — MELOXICAM 15 MG PO TABS: 15.0000 mg | ORAL_TABLET | Freq: Every day | ORAL | 1 refills | Status: AC

## 2023-10-31 MED ORDER — ALBUTEROL SULFATE HFA 108 (90 BASE) MCG/ACT IN AERS: 1.0000 | INHALATION_SPRAY | Freq: Four times a day (QID) | RESPIRATORY_TRACT | 6 refills | Status: AC | PRN

## 2023-10-31 MED ORDER — SIMVASTATIN 40 MG PO TABS: 40.0000 mg | ORAL_TABLET | Freq: Every day | ORAL | 1 refills | Status: AC

## 2023-10-31 MED ORDER — TRAZODONE HCL 50 MG PO TABS: 50.0000 mg | ORAL_TABLET | Freq: Every evening | ORAL | 1 refills | Status: AC | PRN

## 2023-10-31 NOTE — Progress Notes (Signed)
 BP 125/81 (BP Location: Left Arm, Patient Position: Sitting, Cuff Size: Normal)   Pulse (!) 47   Temp (!) 97.4 F (36.3 C)   Ht 5' 9 (1.753 m)   Wt 168 lb (76.2 kg)   SpO2 96%   BMI 24.81 kg/m    Subjective:    Patient ID: Wayne Robles, male    DOB: Feb 26, 1945, 78 y.o.   MRN: [id number]  HPI: Wayne Robles is a 78 y.o. male presenting on 10/31/2023 for comprehensive medical examination. Current medical complaints include:  Impaired Fasting Glucose HbA1C:  Lab Results  Component Value Date   HGBA1C 5.6 10/31/2023   Duration of elevated blood sugar: chronic Polydipsia: no Polyuria: no Weight change: no Visual disturbance: no Glucose Monitoring: no Diabetic Education: Not Completed Family history of diabetes: no  HYPERTENSION / HYPERLIPIDEMIA Satisfied with current treatment? yes Duration of hypertension: chronic BP monitoring frequency: not checking BP medication side effects: no Past BP meds: none Duration of hyperlipidemia: chronic Cholesterol medication side effects: no Cholesterol supplements: none Past cholesterol medications: zetia , simvastatin  Medication compliance: excellent compliance Aspirin: yes Recent stressors: no Recurrent headaches: no Visual changes: no Palpitations: no Dyspnea: no Chest pain: no Lower extremity edema: no Dizzy/lightheaded: no  DEPRESSION Mood status: controlled Satisfied with current treatment?: yes Symptom severity: mild  Duration of current treatment : chronic Side effects: no Medication compliance: excellent compliance Psychotherapy/counseling: yes  Previous psychiatric medications: wellbutrin  Depressed mood: yes Anxious mood: no Anhedonia: no Significant weight loss or gain: no Insomnia: no  Fatigue: no Feelings of worthlessness or guilt: no Impaired concentration/indecisiveness: no Suicidal ideations: no Hopelessness: no Crying spells: no    10/31/2023    8:23 AM 10/31/2023    8:16 AM 10/26/2022     9:08 AM 04/04/2022    8:17 AM 02/25/2022    2:16 PM  Depression screen PHQ 2/9  Decreased Interest 0 0 0 0 0  Down, Depressed, Hopeless 0 0 0 0 1  PHQ - 2 Score 0 0 0 0 1  Altered sleeping 0  0 1 1  Tired, decreased energy 1  0 0 0  Change in appetite 0  0 0 0  Feeling bad or failure about yourself  0  1 1 0  Trouble concentrating 0  0 0 0  Moving slowly or fidgety/restless 0  0 0 0  Suicidal thoughts 0  0 1 0  PHQ-9 Score 1  1 3 2   Difficult doing work/chores Not difficult at all  Not difficult at all Not difficult at all Not difficult at all    He currently lives with: wife Interim Problems from his last visit: no  Functional Status Survey: Is the patient deaf or have difficulty hearing?: No Does the patient have difficulty seeing, even when wearing glasses/contacts?: No Does the patient have difficulty concentrating, remembering, or making decisions?: No Does the patient have difficulty walking or climbing stairs?: No Does the patient have difficulty dressing or bathing?: No Does the patient have difficulty doing errands alone such as visiting a doctor's office or shopping?: No  FALL RISK:    10/31/2023    8:23 AM 10/26/2022    9:08 AM 04/04/2022    8:17 AM 02/25/2022    2:16 PM 12/10/2021    8:07 AM  Fall Risk   Falls in the past year? 1 0 0 0 0  Number falls in past yr: 0 0 0 0 0  Injury with Fall? 0 0 0 0 0  Risk for fall due to : No Fall Risks No Fall Risks No Fall Risks No Fall Risks No Fall Risks  Follow up  Falls evaluation completed Falls evaluation completed Falls evaluation completed  Falls evaluation completed      Data saved with a previous flowsheet row definition    Depression Screen    10/31/2023    8:23 AM 10/31/2023    8:16 AM 10/26/2022    9:08 AM 04/04/2022    8:17 AM 02/25/2022    2:16 PM  Depression screen PHQ 2/9  Decreased Interest 0 0 0 0 0  Down, Depressed, Hopeless 0 0 0 0 1  PHQ - 2 Score 0 0 0 0 1  Altered sleeping 0  0 1 1  Tired,  decreased energy 1  0 0 0  Change in appetite 0  0 0 0  Feeling bad or failure about yourself  0  1 1 0  Trouble concentrating 0  0 0 0  Moving slowly or fidgety/restless 0  0 0 0  Suicidal thoughts 0  0 1 0  PHQ-9 Score 1  1 3 2   Difficult doing work/chores Not difficult at all  Not difficult at all Not difficult at all Not difficult at all    Advanced Directives Does patient have a HCPOA?    no If yes, name and contact information:  Does patient have a living will or MOST form?  no  Past Medical History:  Past Medical History:  Diagnosis Date   Depression    History of colonic polyps    Hyperlipidemia    Hyperthyroidism    Vitamin D deficiency     Surgical History:  Past Surgical History:  Procedure Laterality Date   APPENDECTOMY     tonsillectomy      Medications:  Current Outpatient Medications on File Prior to Visit  Medication Sig   aspirin EC 81 MG tablet Take 81 mg by mouth.   Multiple Vitamin (MULTIVITAMIN) tablet Take 1 tablet by mouth daily with breakfast.   TYLENOL  500 MG tablet Take 500 mg by mouth every 6 (six) hours as needed for mild pain or headache.   diclofenac  Sodium (VOLTAREN ) 1 % GEL Apply 4 g topically 4 (four) times daily. (Patient not taking: Reported on 10/31/2023)   triamcinolone  cream (KENALOG ) 0.1 % Apply 1 Application topically 2 (two) times daily. (Patient not taking: Reported on 10/31/2023)   No current facility-administered medications on file prior to visit.    Allergies:  No Known Allergies  Social History:  Social History   Socioeconomic History   Marital status: Married    Spouse name: Not on file   Number of children: Not on file   Years of education: Not on file   Highest education level: GED or equivalent  Occupational History   Not on file  Tobacco Use   Smoking status: Never   Smokeless tobacco: Never  Vaping Use   Vaping status: Never Used  Substance and Sexual Activity   Alcohol use: No   Drug use: No   Sexual  activity: Yes  Other Topics Concern   Not on file  Social History Narrative   Not on file   Social Drivers of Health   Financial Resource Strain: Low Risk  (07/25/2023)   Overall Financial Resource Strain (CARDIA)    Difficulty of Paying Living Expenses: Not hard at all  Food Insecurity: No Food Insecurity (07/25/2023)   Hunger Vital Sign    Worried About  Running Out of Food in the Last Year: Never true    Ran Out of Food in the Last Year: Never true  Transportation Needs: No Transportation Needs (07/25/2023)   PRAPARE - Administrator, Civil Service (Medical): No    Lack of Transportation (Non-Medical): No  Physical Activity: Insufficiently Active (07/25/2023)   Exercise Vital Sign    Days of Exercise per Week: 1 day    Minutes of Exercise per Session: 10 min  Stress: No Stress Concern Present (07/25/2023)   [employer] of Occupational Health - Occupational Stress Questionnaire    Feeling of Stress: Not at all  Social Connections: Unknown (07/25/2023)   Social Connection and Isolation Panel    Frequency of Communication with Friends and Family: Twice a week    Frequency of Social Gatherings with Friends and Family: Once a week    Attends Religious Services: Not on Insurance claims handler of Clubs or Organizations: No    Attends Banker Meetings: Not on file    Marital Status: Married  Intimate Partner Violence: Not At Risk (10/31/2023)   Humiliation, Afraid, Rape, and Kick questionnaire    Fear of Current or Ex-Partner: No    Emotionally Abused: No    Physically Abused: No    Sexually Abused: No   Social History   Tobacco Use  Smoking Status Never  Smokeless Tobacco Never   Social History   Substance and Sexual Activity  Alcohol Use No    Family History:  Family History  Problem Relation Age of Onset   Cancer Mother        Ovarian   Diabetes Father    Cancer Father        Prostate   Anxiety disorder Daughter     Past medical  history, surgical history, medications, allergies, family history and social history reviewed with patient today and changes made to appropriate areas of the chart.   Review of Systems  Constitutional: Negative.   HENT: Negative.    Eyes: Negative.   Respiratory:  Positive for shortness of breath. Negative for cough, hemoptysis, sputum production and wheezing.   Cardiovascular: Negative.   Gastrointestinal: Negative.   Genitourinary: Negative.   Musculoskeletal: Negative.   Skin: Negative.   Neurological:  Positive for dizziness. Negative for tingling, tremors, sensory change, speech change, focal weakness, seizures, loss of consciousness, weakness and headaches.  Endo/Heme/Allergies: Negative.   Psychiatric/Behavioral: Negative.     All other ROS negative except what is listed above and in the HPI.      Objective:    BP 125/81 (BP Location: Left Arm, Patient Position: Sitting, Cuff Size: Normal)   Pulse (!) 47   Temp (!) 97.4 F (36.3 C)   Ht 5' 9 (1.753 m)   Wt 168 lb (76.2 kg)   SpO2 96%   BMI 24.81 kg/m   Wt Readings from Last 3 Encounters:  10/31/23 168 lb (76.2 kg)  07/25/23 174 lb 12.8 oz (79.3 kg)  04/25/23 179 lb 9.6 oz (81.5 kg)    Physical Exam Vitals and nursing note reviewed.  Constitutional:      General: He is not in acute distress.    Appearance: Normal appearance. He is not ill-appearing, toxic-appearing or diaphoretic.  HENT:     Head: Normocephalic and atraumatic.     Right Ear: Tympanic membrane, ear canal and external ear normal. There is no impacted cerumen.     Left Ear: Tympanic membrane, ear canal  and external ear normal. There is no impacted cerumen.     Nose: Nose normal. No congestion or rhinorrhea.     Mouth/Throat:     Mouth: Mucous membranes are moist.     Pharynx: Oropharynx is clear. No oropharyngeal exudate or posterior oropharyngeal erythema.  Eyes:     General: No scleral icterus.       Right eye: No discharge.        Left eye:  No discharge.     Extraocular Movements: Extraocular movements intact.     Conjunctiva/sclera: Conjunctivae normal.     Pupils: Pupils are equal, round, and reactive to light.  Neck:     Vascular: No carotid bruit.  Cardiovascular:     Rate and Rhythm: Normal rate and regular rhythm.     Pulses: Normal pulses.     Heart sounds: No murmur heard.    No friction rub. No gallop.  Pulmonary:     Effort: Pulmonary effort is normal. No respiratory distress.     Breath sounds: Normal breath sounds. No stridor. No wheezing, rhonchi or rales.  Chest:     Chest wall: No tenderness.  Abdominal:     General: Abdomen is flat. Bowel sounds are normal. There is no distension.     Palpations: Abdomen is soft. There is no mass.     Tenderness: There is no abdominal tenderness. There is no right CVA tenderness, left CVA tenderness, guarding or rebound.     Hernia: No hernia is present.  Genitourinary:    Comments: Genital exam deferred with shared decision making Musculoskeletal:        General: No swelling, tenderness, deformity or signs of injury.     Cervical back: Normal range of motion and neck supple. No rigidity. No muscular tenderness.     Right lower leg: No edema.     Left lower leg: No edema.  Lymphadenopathy:     Cervical: No cervical adenopathy.  Skin:    General: Skin is warm and dry.     Capillary Refill: Capillary refill takes less than 2 seconds.     Coloration: Skin is not jaundiced or pale.     Findings: No bruising, erythema, lesion or rash.  Neurological:     General: No focal deficit present.     Mental Status: He is alert and oriented to person, place, and time.     Cranial Nerves: No cranial nerve deficit.     Sensory: No sensory deficit.     Motor: No weakness.     Coordination: Coordination normal.     Gait: Gait normal.     Deep Tendon Reflexes: Reflexes normal.  Psychiatric:        Mood and Affect: Mood normal.        Behavior: Behavior normal.        Thought  Content: Thought content normal.        Judgment: Judgment normal.        10/31/2023    8:18 AM 10/26/2022    9:21 AM  6CIT Screen  What Year? 0 points 0 points  What month? 0 points 0 points  What time? 0 points 0 points  Count back from 20 2 points 0 points  Months in reverse 0 points 2 points  Repeat phrase 0 points 2 points  Total Score 2 points 4 points     Results for orders placed or performed in visit on 10/31/23  Bayer DCA Hb A1c [facility]  Time: 10/31/23  8:46 AM  Result Value Ref Range   HB A1C (BAYER DCA - WAIVED) 5.6 4.8 - 5.6 %  Microalbumin, Urine Waived   Collection Time: 10/31/23  8:46 AM  Result Value Ref Range   Microalb, Ur Waived 30 (H) 0 - 19 mg/L   Creatinine, Urine Waived 200 10 - 300 mg/dL   Microalb/Creat Ratio <30 <30 mg/g  Comprehensive metabolic panel with GFR   Collection Time: 10/31/23  8:49 AM  Result Value Ref Range   Glucose 91 70 - 99 mg/dL   BUN 18 8 - 27 mg/dL   Creatinine, Ser 8.94 0.76 - 1.27 mg/dL   eGFR 74 >40 fO/fpw/8.26   BUN/Creatinine Ratio 17 10 - 24   Sodium 138 134 - 144 mmol/L   Potassium 4.3 3.5 - 5.2 mmol/L   Chloride 98 96 - 106 mmol/L   CO2 23 20 - 29 mmol/L   Calcium 9.7 8.6 - 10.2 mg/dL   Total Protein 7.5 6.0 - 8.5 g/dL   Albumin 4.7 3.8 - 4.8 g/dL   Globulin, Total 2.8 1.5 - 4.5 g/dL   Bilirubin Total 1.0 0.0 - 1.2 mg/dL   Alkaline Phosphatase 133 (H) 47 - 123 IU/L   AST 29 0 - 40 IU/L   ALT 29 0 - 44 IU/L  CBC with Differential/Platelet   Collection Time: 10/31/23  8:49 AM  Result Value Ref Range   WBC 7.1 3.4 - 10.8 x10E3/uL   RBC 5.16 4.14 - 5.80 x10E6/uL   Hemoglobin 16.8 13.0 - 17.7 g/dL   Hematocrit 49.1 62.4 - 51.0 %   MCV 98 (H) 79 - 97 fL   MCH 32.6 26.6 - 33.0 pg   MCHC 33.1 31.5 - 35.7 g/dL   RDW 87.5 88.3 - 84.5 %   Platelets 221 150 - 450 x10E3/uL   Neutrophils 69 Not Estab. %   Lymphs 22 Not Estab. %   Monocytes 8 Not Estab. %   Eos 1 Not Estab. %   Basos 0 Not Estab. %    Neutrophils Absolute 4.9 1.4 - 7.0 x10E3/uL   Lymphocytes Absolute 1.5 0.7 - 3.1 x10E3/uL   Monocytes Absolute 0.5 0.1 - 0.9 x10E3/uL   EOS (ABSOLUTE) 0.1 0.0 - 0.4 x10E3/uL   Basophils Absolute 0.0 0.0 - 0.2 x10E3/uL   Immature Granulocytes 0 Not Estab. %   Immature Grans (Abs) 0.0 0.0 - 0.1 x10E3/uL  Lipid Panel w/o Chol/HDL Ratio   Collection Time: 10/31/23  8:49 AM  Result Value Ref Range   Cholesterol, Total 123 100 - 199 mg/dL   Triglycerides 859 0 - 149 mg/dL   HDL 44 >60 mg/dL   VLDL Cholesterol Cal 24 5 - 40 mg/dL   LDL Chol Calc (NIH) 55 0 - 99 mg/dL  PSA   Collection Time: 10/31/23  8:49 AM  Result Value Ref Range   Prostate Specific Ag, Serum 2.0 0.0 - 4.0 ng/mL  TSH   Collection Time: 10/31/23  8:49 AM  Result Value Ref Range   TSH 2.960 0.450 - 4.500 uIU/mL  QuantiFERON-TB Gold Plus   Collection Time: 10/31/23  8:49 AM  Result Value Ref Range   QuantiFERON Incubation Incubation performed.    QuantiFERON Criteria Comment    QuantiFERON TB1 Ag Value 0.03 IU/mL   QuantiFERON TB2 Ag Value 0.03 IU/mL   QuantiFERON Nil Value 0.04 IU/mL   QuantiFERON Mitogen Value 4.41 IU/mL   QuantiFERON-TB Gold Plus Negative Negative  Assessment & Plan:   Problem List Items Addressed This Visit       Cardiovascular and Mediastinum   Aortic atherosclerosis   Will keep BP and cholesterol under good control. Continue to monitor. Call with any concerns.       Relevant Medications   ezetimibe  (ZETIA ) 10 MG tablet   sildenafil  (VIAGRA ) 100 MG tablet   simvastatin  (ZOCOR ) 40 MG tablet   Other Relevant Orders   Comprehensive metabolic panel with GFR (Completed)   CBC with Differential/Platelet (Completed)     Endocrine   IFG (impaired fasting glucose)   Rechecking labs today. Await results.       Relevant Orders   Comprehensive metabolic panel with GFR (Completed)   CBC with Differential/Platelet (Completed)   Bayer DCA Hb A1c Waived (Completed)   Microalbumin, Urine  Waived (Completed)     Other   Depression   Under good control on current regimen. Continue current regimen. Continue to monitor. Call with any concerns. Refills given. Labs drawn today.        Relevant Medications   buPROPion  (WELLBUTRIN  XL) 300 MG 24 hr tablet   traZODone  (DESYREL ) 50 MG tablet   Other Relevant Orders   Comprehensive metabolic panel with GFR (Completed)   CBC with Differential/Platelet (Completed)   TSH (Completed)   Hyperlipidemia   Under good control on current regimen. Continue current regimen. Continue to monitor. Call with any concerns. Refills given. Labs drawn today.       Relevant Medications   ezetimibe  (ZETIA ) 10 MG tablet   sildenafil  (VIAGRA ) 100 MG tablet   simvastatin  (ZOCOR ) 40 MG tablet   Other Relevant Orders   Comprehensive metabolic panel with GFR (Completed)   CBC with Differential/Platelet (Completed)   Lipid Panel w/o Chol/HDL Ratio (Completed)   Other Visit Diagnoses       Encounter for Medicare annual wellness exam    -  Primary   Preventative care discussed today as below.     Routine general medical examination at a health care facility       Vaccines up to date. Screening labs checked today. Colonoscopy up to date. Continue diet and exercise. Call with any concerns.     Trigger finger, left ring finger       Referral to ortho placed today.   Relevant Orders   Ambulatory referral to Hand Surgery     Hesitancy       Labs drawn today. Await results. Treat as needed.   Relevant Orders   PSA (Completed)     Weight loss       Labs drawn today. Await results. Treat as needed.   Relevant Orders   QuantiFERON-TB Gold Plus (Completed)   Fecal occult blood, imunochemical     Needs flu shot       Flu shot given today.   Relevant Orders   Flu vaccine HIGH DOSE PF(Fluzone Trivalent) (Completed)     Need for COVID-19 vaccine       Covid shot given today.   Relevant Orders   Civil engineer, contracting Covid -19 Vaccine 73yrs and older  (Completed)        Preventative Services:  Health Risk Assessment and Personalized Prevention Plan: Done today Bone Mass Measurements: N/A CVD Screening: Done today Colon Cancer Screening: Up to date Depression Screening: Done today Diabetes Screening: Done today Glaucoma Screening: See your eye doctor Hepatitis B vaccine: N/A Hepatitis C screening: Up to date HIV Screening: up to date Flu Vaccine: Done today  Lung cancer Screening: N/A Obesity Screening: Done today Pneumonia Vaccines: Up to date STI Screening: N/A PSA screening: Done today  Discussed aspirin prophylaxis for myocardial infarction prevention and decision was made to continue ASA  LABORATORY TESTING:  Health maintenance labs ordered today as discussed above.   The natural history of prostate cancer and ongoing controversy regarding screening and potential treatment outcomes of prostate cancer has been discussed with the patient. The meaning of a false positive PSA and a false negative PSA has been discussed. He indicates understanding of the limitations of this screening test and wishes to proceed with screening PSA testing.   IMMUNIZATIONS:   - Tdap: Tetanus vaccination status reviewed: last tetanus booster within 10 years. - Influenza: Administered today - Pneumovax: Up to date - Prevnar: Up to date - Zostavax vaccine: Up to date  SCREENING: - Colonoscopy: Up to date  Discussed with patient purpose of the colonoscopy is to detect colon cancer at curable precancerous or early stages   PATIENT COUNSELING:    Sexuality: Discussed sexually transmitted diseases, partner selection, use of condoms, avoidance of unintended pregnancy  and contraceptive alternatives.   Advised to avoid cigarette smoking.  I discussed with the patient that most people either abstain from alcohol or drink within safe limits (<=14/week and <=4 drinks/occasion for males, <=7/weeks and <= 3 drinks/occasion for females) and that the  risk for alcohol disorders and other health effects rises proportionally with the number of drinks per week and how often a drinker exceeds daily limits.  Discussed cessation/primary prevention of drug use and availability of treatment for abuse.   Diet: Encouraged to adjust caloric intake to maintain  or achieve ideal body weight, to reduce intake of dietary saturated fat and total fat, to limit sodium intake by avoiding high sodium foods and not adding table salt, and to maintain adequate dietary potassium and calcium preferably from fresh fruits, vegetables, and low-fat dairy products.    stressed the importance of regular exercise  Injury prevention: Discussed safety belts, safety helmets, smoke detector, smoking near bedding or upholstery.   Dental health: Discussed importance of regular tooth brushing, flossing, and dental visits.   Follow up plan: NEXT PREVENTATIVE PHYSICAL DUE IN 1 YEAR. Return 2-3 months.

## 2023-10-31 NOTE — Progress Notes (Signed)
 Subjective:   Wayne Robles is a 78 y.o. male who presents for Medicare Annual/Subsequent preventive examination.  Visit Complete: In person  Patient Medicare AWV questionnaire was completed by the patient on 10/31/23; I have confirmed that all information answered by patient is correct and no changes since this date.        Objective:    Today's Vitals   10/31/23 0808  BP: 125/81  Pulse: (!) 47  Temp: (!) 97.4 F (36.3 C)  SpO2: 96%  Weight: 168 lb (76.2 kg)  Height: 5' 9 (1.753 m)   Body mass index is 24.81 kg/m.     10/31/2023    8:50 AM 11/04/2022    1:57 PM 02/17/2021    9:05 AM  Advanced Directives  Does Patient Have a Medical Advance Directive? No No No  Would patient like information on creating a medical advance directive? Yes (MAU/Ambulatory/Procedural Areas - Information given) No - Patient declined No - Patient declined    Current Medications (verified) Outpatient Encounter Medications as of 10/31/2023  Medication Sig   aspirin EC 81 MG tablet Take 81 mg by mouth.   Multiple Vitamin (MULTIVITAMIN) tablet Take 1 tablet by mouth daily with breakfast.   TYLENOL  500 MG tablet Take 500 mg by mouth every 6 (six) hours as needed for mild pain or headache.   [DISCONTINUED] albuterol  (VENTOLIN  HFA) 108 (90 Base) MCG/ACT inhaler Inhale 1-2 puffs into the lungs every 6 (six) hours as needed for wheezing or shortness of breath.   [DISCONTINUED] buPROPion  (WELLBUTRIN  XL) 300 MG 24 hr tablet TAKE 1 TABLET(300 MG) BY MOUTH DAILY   [DISCONTINUED] ezetimibe  (ZETIA ) 10 MG tablet Take 1 tablet (10 mg total) by mouth daily.   [DISCONTINUED] meloxicam  (MOBIC ) 15 MG tablet Take 1 tablet (15 mg total) by mouth daily.   [DISCONTINUED] sildenafil  (VIAGRA ) 100 MG tablet Take 0.5-1 tablets (50-100 mg total) by mouth daily as needed for erectile dysfunction.   [DISCONTINUED] simvastatin  (ZOCOR ) 40 MG tablet Take 1 tablet (40 mg total) by mouth daily.   [DISCONTINUED] traZODone   (DESYREL ) 50 MG tablet Take 1 tablet (50 mg total) by mouth at bedtime as needed. for sleep   albuterol  (VENTOLIN  HFA) 108 (90 Base) MCG/ACT inhaler Inhale 1-2 puffs into the lungs every 6 (six) hours as needed for wheezing or shortness of breath.   buPROPion  (WELLBUTRIN  XL) 300 MG 24 hr tablet TAKE 1 TABLET(300 MG) BY MOUTH DAILY   diclofenac  Sodium (VOLTAREN ) 1 % GEL Apply 4 g topically 4 (four) times daily. (Patient not taking: Reported on 10/31/2023)   ezetimibe  (ZETIA ) 10 MG tablet Take 1 tablet (10 mg total) by mouth daily.   meloxicam  (MOBIC ) 15 MG tablet Take 1 tablet (15 mg total) by mouth daily.   sildenafil  (VIAGRA ) 100 MG tablet Take 0.5-1 tablets (50-100 mg total) by mouth daily as needed for erectile dysfunction.   simvastatin  (ZOCOR ) 40 MG tablet Take 1 tablet (40 mg total) by mouth daily.   traZODone  (DESYREL ) 50 MG tablet Take 1 tablet (50 mg total) by mouth at bedtime as needed. for sleep   triamcinolone  cream (KENALOG ) 0.1 % Apply 1 Application topically 2 (two) times daily. (Patient not taking: Reported on 10/31/2023)   No facility-administered encounter medications on file as of 10/31/2023.    Allergies (verified) Patient has no known allergies.   History: Past Medical History:  Diagnosis Date   Depression    History of colonic polyps    Hyperlipidemia    Hyperthyroidism  Vitamin D deficiency    Past Surgical History:  Procedure Laterality Date   APPENDECTOMY     tonsillectomy     Family History  Problem Relation Age of Onset   Cancer Mother        Ovarian   Diabetes Father    Cancer Father        Prostate   Anxiety disorder Daughter    Social History   Socioeconomic History   Marital status: Married    Spouse name: Not on file   Number of children: Not on file   Years of education: Not on file   Highest education level: GED or equivalent  Occupational History   Not on file  Tobacco Use   Smoking status: Never   Smokeless tobacco: Never  Vaping  Use   Vaping status: Never Used  Substance and Sexual Activity   Alcohol use: No   Drug use: No   Sexual activity: Yes  Other Topics Concern   Not on file  Social History Narrative   Not on file   Social Drivers of Health   Financial Resource Strain: Low Risk  (07/25/2023)   Overall Financial Resource Strain (CARDIA)    Difficulty of Paying Living Expenses: Not hard at all  Food Insecurity: No Food Insecurity (07/25/2023)   Hunger Vital Sign    Worried About Running Out of Food in the Last Year: Never true    Ran Out of Food in the Last Year: Never true  Transportation Needs: No Transportation Needs (07/25/2023)   PRAPARE - Administrator, Civil Service (Medical): No    Lack of Transportation (Non-Medical): No  Physical Activity: Insufficiently Active (07/25/2023)   Exercise Vital Sign    Days of Exercise per Week: 1 day    Minutes of Exercise per Session: 10 min  Stress: No Stress Concern Present (07/25/2023)   [employer] of Occupational Health - Occupational Stress Questionnaire    Feeling of Stress: Not at all  Social Connections: Unknown (07/25/2023)   Social Connection and Isolation Panel    Frequency of Communication with Friends and Family: Twice a week    Frequency of Social Gatherings with Friends and Family: Once a week    Attends Religious Services: Not on Marketing executive or Organizations: No    Attends Banker Meetings: Not on file    Marital Status: Married    Tobacco Counseling Counseling given: Not Answered   Clinical Intake:                        Activities of Daily Living    10/31/2023    8:14 AM  In your present state of health, do you have any difficulty performing the following activities:  Hearing? 0  Vision? 0  Difficulty concentrating or making decisions? 0  Walking or climbing stairs? 0  Dressing or bathing? 0  Doing errands, shopping? 0    Patient Care Team: Vicci Duwaine SQUIBB,  DO as PCP - General (Family Medicine)  Indicate any recent Medical Services you may have received from other than [facility] providers in the past year (date may be approximate).     Assessment:   This is a routine wellness examination for Wayne Robles.  Hearing/Vision screen No results found.   Goals Addressed   None    Depression Screen    10/31/2023    8:23 AM 10/31/2023    8:16 AM 10/26/2022  9:08 AM 04/04/2022    8:17 AM 02/25/2022    2:16 PM 12/10/2021    8:05 AM 06/09/2021    8:48 AM  PHQ 2/9 Scores  PHQ - 2 Score 0 0 0 0 1 0 1  PHQ- 9 Score 1  1 3 2  0 7    Fall Risk    10/31/2023    8:23 AM 10/26/2022    9:08 AM 04/04/2022    8:17 AM 02/25/2022    2:16 PM 12/10/2021    8:07 AM  Fall Risk   Falls in the past year? 1 0 0 0 0  Number falls in past yr: 0 0 0 0 0  Injury with Fall? 0 0 0 0 0  Risk for fall due to : No Fall Risks No Fall Risks No Fall Risks No Fall Risks No Fall Risks  Follow up  Falls evaluation completed Falls evaluation completed Falls evaluation completed  Falls evaluation completed      Data saved with a previous flowsheet row definition    MEDICARE RISK AT HOME: Medicare Risk at Home Any stairs in or around the home?: Yes If so, are there any without handrails?: Yes Home free of loose throw rugs in walkways, pet beds, electrical cords, etc?: Yes Adequate lighting in your home to reduce risk of falls?: No Life alert?: No Use of a cane, walker or w/c?: No Grab bars in the bathroom?: No Shower chair or bench in shower?: No Elevated toilet seat or a handicapped toilet?: No  TIMED UP AND GO:  Was the test performed?  Yes  Length of time to ambulate 10 feet: 10 sec Gait steady and fast without use of assistive device    Cognitive Function:        10/31/2023    8:18 AM 10/26/2022    9:21 AM  6CIT Screen  What Year? 0 points 0 points  What month? 0 points 0 points  What time? 0 points 0 points  Count back from 20 2 points 0 points  Months in  reverse 0 points 2 points  Repeat phrase 0 points 2 points  Total Score 2 points 4 points    Immunizations Immunization History  Administered Date(s) Administered   Fluad Quad(high Dose 65+) 12/10/2020, 11/26/2021   Fluad Trivalent(High Dose 65+) 10/26/2022   INFLUENZA, HIGH DOSE SEASONAL PF 11/28/2013, 01/30/2015, 11/25/2015, 11/04/2016, 11/26/2021, 10/31/2023   Influenza Inj Mdck Quad With Preservative 02/17/2020   Influenza-Unspecified 11/08/2019, 11/26/2021   PFIZER(Purple Top)SARS-COV-2 Vaccination 04/11/2019, 05/02/2019, 02/13/2020   PNEUMOCOCCAL CONJUGATE-20 06/09/2021   Pfizer Covid-19 Vaccine Bivalent Booster 54yrs & up 12/07/2020   Pfizer Covid-19 Vaccine Bivalent Booster 5y-11y 12/07/2020   Pfizer(Comirnaty)Fall Seasonal Vaccine 12 years and older 11/26/2021, 10/31/2023   Pneumococcal Conjugate-13 03/21/2014   Pneumococcal Polysaccharide-23 04/08/2015   Td 06/26/2020   Tdap 03/21/2014, 06/22/2020   Zoster Recombinant(Shingrix) 07/01/2020, 10/15/2020    TDAP status: Up to date  Flu Vaccine status: Completed at today's visit  Pneumococcal vaccine status: Up to date  Covid-19 vaccine status: Completed vaccines  Qualifies for Shingles Vaccine? Yes   Zostavax completed No   Shingrix Completed?: Yes  Screening Tests Health Maintenance  Topic Date Due   COVID-19 Vaccine (8 - 2024-25 season) 04/29/2024   Medicare Annual Wellness (AWV)  10/30/2024   Colonoscopy  02/24/2025   DTaP/Tdap/Td (4 - Td or Tdap) 06/27/2030   Pneumococcal Vaccine: 50+ Years  Completed   Influenza Vaccine  Completed   Hepatitis C Screening  Completed  Zoster Vaccines- Shingrix  Completed   HPV VACCINES  Aged Out   Meningococcal B Vaccine  Aged Out    Health Maintenance  There are no preventive care reminders to display for this patient.   Colorectal cancer screening: Type of screening: Colonoscopy. Completed 11/24/20. Repeat every 5 years  Lung Cancer Screening: (Low Dose CT Chest  recommended if Age 33-80 years, 20 pack-year currently smoking OR have quit w/in 15years.) does not qualify.   Additional Screening:  Hepatitis C Screening: does qualify; Completed 12/10/21  Vision Screening: Recommended annual ophthalmology exams for early detection of glaucoma and other disorders of the eye.  Dental Screening: Recommended annual dental exams for proper oral hygiene  Community Resource Referral / Chronic Care Management: CRR required this visit?  No   CCM required this visit?  No     Plan:     I have personally reviewed and noted the following in the patient's chart:   Medical and social history Use of alcohol, tobacco or illicit drugs  Current medications and supplements including opioid prescriptions. Patient is not currently taking opioid prescriptions. Functional ability and status Nutritional status Physical activity Advanced directives List of other physicians Hospitalizations, surgeries, and ER visits in previous 12 months Vitals Screenings to include cognitive, depression, and falls Referrals and appointments  In addition, I have reviewed and discussed with patient certain preventive protocols, quality metrics, and best practice recommendations. A written personalized care plan for preventive services as well as general preventive health recommendations were provided to patient.     Duwaine Louder, DO   11/06/2023   After Visit Summary: (In Person-Printed) AVS printed and given to the patient

## 2023-10-31 NOTE — Patient Instructions (Signed)
 Preventative Services:  Health Risk Assessment and Personalized Prevention Plan: Done today Bone Mass Measurements: N/A CVD Screening: Done today Colon Cancer Screening: Up to date Depression Screening: Done today Diabetes Screening: Done today Glaucoma Screening: See your eye doctor Hepatitis B vaccine: N/A Hepatitis C screening: Up to date HIV Screening: up to date Flu Vaccine: Done today Lung cancer Screening: N/A Obesity Screening: Done today Pneumonia Vaccines: Up to date STI Screening: N/A PSA screening: Done today   Wayne Robles , Thank you for taking time to come for your Medicare Wellness Visit. I appreciate your ongoing commitment to your health goals. Please review the following plan we discussed and let me know if I can assist you in the future.   These are the goals we discussed:  Goals      Patient Stated     Increase physical activity        This is a list of the screening recommended for you and due dates:  Health Maintenance  Topic Date Due   Flu Shot  09/08/2023   COVID-19 Vaccine (7 - 2025-26 season) 10/09/2023   Medicare Annual Wellness Visit  10/26/2023   Colon Cancer Screening  02/24/2025   DTaP/Tdap/Td vaccine (4 - Td or Tdap) 06/27/2030   Pneumococcal Vaccine for age over 3  Completed   Hepatitis C Screening  Completed   Zoster (Shingles) Vaccine  Completed   HPV Vaccine  Aged Out   Meningitis B Vaccine  Aged Out

## 2023-11-02 ENCOUNTER — Ambulatory Visit: Payer: Self-pay | Admitting: Family Medicine

## 2023-11-02 DIAGNOSIS — R195 Other fecal abnormalities: Secondary | ICD-10-CM

## 2023-11-02 DIAGNOSIS — R634 Abnormal weight loss: Secondary | ICD-10-CM

## 2023-11-03 DIAGNOSIS — M65342 Trigger finger, left ring finger: Secondary | ICD-10-CM | POA: Diagnosis not present

## 2023-11-04 LAB — COMPREHENSIVE METABOLIC PANEL WITH GFR
ALT: 29 IU/L (ref 0–44)
AST: 29 IU/L (ref 0–40)
Albumin: 4.7 g/dL (ref 3.8–4.8)
Alkaline Phosphatase: 133 IU/L — ABNORMAL HIGH (ref 47–123)
BUN/Creatinine Ratio: 17 (ref 10–24)
BUN: 18 mg/dL (ref 8–27)
Bilirubin Total: 1 mg/dL (ref 0.0–1.2)
CO2: 23 mmol/L (ref 20–29)
Calcium: 9.7 mg/dL (ref 8.6–10.2)
Chloride: 98 mmol/L (ref 96–106)
Creatinine, Ser: 1.05 mg/dL (ref 0.76–1.27)
Globulin, Total: 2.8 g/dL (ref 1.5–4.5)
Glucose: 91 mg/dL (ref 70–99)
Potassium: 4.3 mmol/L (ref 3.5–5.2)
Sodium: 138 mmol/L (ref 134–144)
Total Protein: 7.5 g/dL (ref 6.0–8.5)
eGFR: 74 mL/min/1.73 (ref >59)

## 2023-11-04 LAB — CBC WITH DIFFERENTIAL/PLATELET
Basophils Absolute: 0 x10E3/uL (ref 0.0–0.2)
Basos: 0 %
EOS (ABSOLUTE): 0.1 x10E3/uL (ref 0.0–0.4)
Eos: 1 %
Hematocrit: 50.8 % (ref 37.5–51.0)
Hemoglobin: 16.8 g/dL (ref 13.0–17.7)
Immature Grans (Abs): 0 x10E3/uL (ref 0.0–0.1)
Immature Granulocytes: 0 %
Lymphocytes Absolute: 1.5 x10E3/uL (ref 0.7–3.1)
Lymphs: 22 %
MCH: 32.6 pg (ref 26.6–33.0)
MCHC: 33.1 g/dL (ref 31.5–35.7)
MCV: 98 fL — ABNORMAL HIGH (ref 79–97)
Monocytes Absolute: 0.5 x10E3/uL (ref 0.1–0.9)
Monocytes: 8 %
Neutrophils Absolute: 4.9 x10E3/uL (ref 1.4–7.0)
Neutrophils: 69 %
Platelets: 221 x10E3/uL (ref 150–450)
RBC: 5.16 x10E6/uL (ref 4.14–5.80)
RDW: 12.4 % (ref 11.6–15.4)
WBC: 7.1 x10E3/uL (ref 3.4–10.8)

## 2023-11-04 LAB — LIPID PANEL W/O CHOL/HDL RATIO
Cholesterol, Total: 123 mg/dL (ref 100–199)
HDL: 44 mg/dL (ref >39)
LDL Chol Calc (NIH): 55 mg/dL (ref 0–99)
Triglycerides: 140 mg/dL (ref 0–149)
VLDL Cholesterol Cal: 24 mg/dL (ref 5–40)

## 2023-11-04 LAB — QUANTIFERON-TB GOLD PLUS
QuantiFERON Mitogen Value: 4.41 [IU]/mL
QuantiFERON Nil Value: 0.04 [IU]/mL
QuantiFERON TB1 Ag Value: 0.03 [IU]/mL
QuantiFERON TB2 Ag Value: 0.03 [IU]/mL
QuantiFERON-TB Gold Plus: NEGATIVE

## 2023-11-04 LAB — TSH: TSH: 2.96 u[IU]/mL (ref 0.450–4.500)

## 2023-11-04 LAB — PSA: Prostate Specific Ag, Serum: 2 ng/mL (ref 0.0–4.0)

## 2023-11-06 NOTE — Assessment & Plan Note (Signed)
 Will keep BP and cholesterol under good control. Continue to monitor. Call with any concerns.

## 2023-11-06 NOTE — Assessment & Plan Note (Signed)
 Under good control on current regimen. Continue current regimen. Continue to monitor. Call with any concerns. Refills given. Labs drawn today.

## 2023-11-06 NOTE — Assessment & Plan Note (Signed)
 Rechecking labs today. Await results.

## 2023-11-08 DIAGNOSIS — R634 Abnormal weight loss: Secondary | ICD-10-CM | POA: Diagnosis not present

## 2023-11-09 LAB — FECAL OCCULT BLOOD, IMMUNOCHEMICAL: Fecal Occult Bld: POSITIVE — AB

## 2023-11-15 ENCOUNTER — Telehealth: Payer: Self-pay | Admitting: Family Medicine

## 2023-11-15 NOTE — Telephone Encounter (Unsigned)
 Copied from CRM [id number]. Topic: Referral - Question >> Nov 15, 2023  9:34 AM Selinda RAMAN wrote: Reason for CRM: Olam the spouse of the patient called in asking for his provider to send a new referral for Gastroenterology and Colonoscopy anywhere that takes his insurance. She states Dr  ruel Kung is scheduled out until well into next year and this cannot wait as he has had changes in his bowels and has lost 20 pounds. Please assist as soon as possible with new referral elsewhere that accepts his insurance. She said she doesn't care if they have to travel a little bit as long as the appointment is as soon as possible.

## 2023-11-21 ENCOUNTER — Encounter: Payer: Self-pay | Admitting: Family Medicine

## 2023-12-03 ENCOUNTER — Encounter: Payer: Self-pay | Admitting: Family Medicine

## 2023-12-29 DIAGNOSIS — R195 Other fecal abnormalities: Secondary | ICD-10-CM | POA: Diagnosis not present

## 2023-12-29 DIAGNOSIS — R634 Abnormal weight loss: Secondary | ICD-10-CM | POA: Diagnosis not present

## 2023-12-29 DIAGNOSIS — Z860100 Personal history of colon polyps, unspecified: Secondary | ICD-10-CM | POA: Diagnosis not present

## 2024-01-08 ENCOUNTER — Encounter: Payer: Self-pay | Admitting: Family Medicine

## 2024-01-08 ENCOUNTER — Ambulatory Visit: Admitting: Family Medicine

## 2024-01-08 VITALS — BP 126/72 | HR 70 | Temp 97.5°F | Ht 69.0 in | Wt 171.4 lb

## 2024-01-08 DIAGNOSIS — R634 Abnormal weight loss: Secondary | ICD-10-CM | POA: Diagnosis not present

## 2024-01-08 DIAGNOSIS — L259 Unspecified contact dermatitis, unspecified cause: Secondary | ICD-10-CM | POA: Diagnosis not present

## 2024-01-08 DIAGNOSIS — R42 Dizziness and giddiness: Secondary | ICD-10-CM

## 2024-01-08 DIAGNOSIS — R195 Other fecal abnormalities: Secondary | ICD-10-CM

## 2024-01-08 DIAGNOSIS — M65342 Trigger finger, left ring finger: Secondary | ICD-10-CM

## 2024-01-08 MED ORDER — PREDNISONE 10 MG PO TABS: ORAL_TABLET | ORAL | 0 refills | Status: DC

## 2024-01-08 NOTE — Progress Notes (Signed)
 BP 126/72   Pulse 70   Temp (!) 97.5 F (36.4 C) (Oral)   Ht 5' 9 (1.753 m)   Wt 171 lb 6.4 oz (77.7 kg)   SpO2 94%   BMI 25.31 kg/m    Subjective:    Patient ID: Wayne Robles, male    DOB: 07/04/1945, 78 y.o.   MRN: [id number]  HPI: Wayne Robles is a 78 y.o. male  Chief Complaint  Patient presents with   Weight Loss    Lack of appetite    Rash    Onset about a month ago,Both legs. Started on back and waist area last night. Denies pain. Redness and itching. Benadryl cream helping some.     Fatigue   Dizziness    Onset 3 weeks ago. Getting worse. Daily. Mostly Sitting to standing. Bending over. Several times a day   Wyn comes in not feeling well. He notes taht he has been really tired without doing much. He notes that he has been really tired with mild activity and needs to sleep. He's been more lightheaded recently. Weight loss seems like it's slowed down. He's been staying between 165-167. He has cut down on how much he's eating. He is scheduled to have his colonoscopy to 01/26/24.   RASH Duration:  3 weeks  Location: legs and back  Itching: yes Burning: no Redness: yes Oozing: no Scaling: yes Blisters: no Painful: no Fevers: no Change in detergents/soaps/personal care products: no Recent illness: no Recent travel:no History of same: no Context: stable Alleviating factors: benadryl Treatments attempted:benadryl and lotion/moisturizer Shortness of breath: no  Throat/tongue swelling: no Myalgias/arthralgias: no   Relevant past medical, surgical, family and social history reviewed and updated as indicated. Interim medical history since our last visit reviewed. Allergies and medications reviewed and updated.  Review of Systems  Constitutional:  Positive for fatigue. Negative for activity change, appetite change, chills, diaphoresis, fever and unexpected weight change.  Respiratory: Negative.    Cardiovascular: Negative.   Musculoskeletal: Negative.    Neurological:  Positive for dizziness. Negative for tremors, seizures, syncope, facial asymmetry, speech difficulty, weakness, light-headedness, numbness and headaches.  Psychiatric/Behavioral: Negative.      Per HPI unless specifically indicated above     Objective:    BP 126/72   Pulse 70   Temp (!) 97.5 F (36.4 C) (Oral)   Ht 5' 9 (1.753 m)   Wt 171 lb 6.4 oz (77.7 kg)   SpO2 94%   BMI 25.31 kg/m   Wt Readings from Last 3 Encounters:  01/08/24 171 lb 6.4 oz (77.7 kg)  10/31/23 168 lb (76.2 kg)  07/25/23 174 lb 12.8 oz (79.3 kg)    Physical Exam Vitals and nursing note reviewed.  Constitutional:      General: He is not in acute distress.    Appearance: Normal appearance. He is not ill-appearing, toxic-appearing or diaphoretic.  HENT:     Head: Normocephalic and atraumatic.     Right Ear: External ear normal.     Left Ear: External ear normal.     Nose: Nose normal.     Mouth/Throat:     Mouth: Mucous membranes are moist.     Pharynx: Oropharynx is clear.  Eyes:     General: No scleral icterus.       Right eye: No discharge.        Left eye: No discharge.     Extraocular Movements: Extraocular movements intact.     Conjunctiva/sclera: Conjunctivae  normal.     Pupils: Pupils are equal, round, and reactive to light.  Cardiovascular:     Rate and Rhythm: Normal rate and regular rhythm.     Pulses: Normal pulses.     Heart sounds: Normal heart sounds. No murmur heard.    No friction rub. No gallop.  Pulmonary:     Effort: Pulmonary effort is normal. No respiratory distress.     Breath sounds: Normal breath sounds. No stridor. No wheezing, rhonchi or rales.  Chest:     Chest wall: No tenderness.  Musculoskeletal:        General: Normal range of motion.     Cervical back: Normal range of motion and neck supple.  Skin:    General: Skin is warm and dry.     Capillary Refill: Capillary refill takes less than 2 seconds.     Coloration: Skin is not jaundiced or  pale.     Findings: Rash (patch of erythematous excoriated skin on bilateral legs) present. No bruising, erythema or lesion.  Neurological:     General: No focal deficit present.     Mental Status: He is alert and oriented to person, place, and time. Mental status is at baseline.  Psychiatric:        Mood and Affect: Mood normal.        Behavior: Behavior normal.        Thought Content: Thought content normal.        Judgment: Judgment normal.     Results for orders placed or performed in visit on 10/31/23  Bayer DCA Hb A1c Waived   Collection Time: 10/31/23  8:46 AM  Result Value Ref Range   HB A1C (BAYER DCA - WAIVED) 5.6 4.8 - 5.6 %  Microalbumin, Urine Waived   Collection Time: 10/31/23  8:46 AM  Result Value Ref Range   Microalb, Ur Waived 30 (H) 0 - 19 mg/L   Creatinine, Urine Waived 200 10 - 300 mg/dL   Microalb/Creat Ratio <30 <30 mg/g  Comprehensive metabolic panel with GFR   Collection Time: 10/31/23  8:49 AM  Result Value Ref Range   Glucose 91 70 - 99 mg/dL   BUN 18 8 - 27 mg/dL   Creatinine, Ser 8.94 0.76 - 1.27 mg/dL   eGFR 74 >40 fO/fpw/8.26   BUN/Creatinine Ratio 17 10 - 24   Sodium 138 134 - 144 mmol/L   Potassium 4.3 3.5 - 5.2 mmol/L   Chloride 98 96 - 106 mmol/L   CO2 23 20 - 29 mmol/L   Calcium 9.7 8.6 - 10.2 mg/dL   Total Protein 7.5 6.0 - 8.5 g/dL   Albumin 4.7 3.8 - 4.8 g/dL   Globulin, Total 2.8 1.5 - 4.5 g/dL   Bilirubin Total 1.0 0.0 - 1.2 mg/dL   Alkaline Phosphatase 133 (H) 47 - 123 IU/L   AST 29 0 - 40 IU/L   ALT 29 0 - 44 IU/L  CBC with Differential/Platelet   Collection Time: 10/31/23  8:49 AM  Result Value Ref Range   WBC 7.1 3.4 - 10.8 x10E3/uL   RBC 5.16 4.14 - 5.80 x10E6/uL   Hemoglobin 16.8 13.0 - 17.7 g/dL   Hematocrit 49.1 62.4 - 51.0 %   MCV 98 (H) 79 - 97 fL   MCH 32.6 26.6 - 33.0 pg   MCHC 33.1 31.5 - 35.7 g/dL   RDW 87.5 88.3 - 84.5 %   Platelets 221 150 - 450 x10E3/uL   Neutrophils 69  Not Estab. %   Lymphs 22 Not  Estab. %   Monocytes 8 Not Estab. %   Eos 1 Not Estab. %   Basos 0 Not Estab. %   Neutrophils Absolute 4.9 1.4 - 7.0 x10E3/uL   Lymphocytes Absolute 1.5 0.7 - 3.1 x10E3/uL   Monocytes Absolute 0.5 0.1 - 0.9 x10E3/uL   EOS (ABSOLUTE) 0.1 0.0 - 0.4 x10E3/uL   Basophils Absolute 0.0 0.0 - 0.2 x10E3/uL   Immature Granulocytes 0 Not Estab. %   Immature Grans (Abs) 0.0 0.0 - 0.1 x10E3/uL  Lipid Panel w/o Chol/HDL Ratio   Collection Time: 10/31/23  8:49 AM  Result Value Ref Range   Cholesterol, Total 123 100 - 199 mg/dL   Triglycerides 859 0 - 149 mg/dL   HDL 44 >60 mg/dL   VLDL Cholesterol Cal 24 5 - 40 mg/dL   LDL Chol Calc (NIH) 55 0 - 99 mg/dL  PSA   Collection Time: 10/31/23  8:49 AM  Result Value Ref Range   Prostate Specific Ag, Serum 2.0 0.0 - 4.0 ng/mL  TSH   Collection Time: 10/31/23  8:49 AM  Result Value Ref Range   TSH 2.960 0.450 - 4.500 uIU/mL  QuantiFERON-TB Gold Plus   Collection Time: 10/31/23  8:49 AM  Result Value Ref Range   QuantiFERON Incubation Incubation performed.    QuantiFERON Criteria Comment    QuantiFERON TB1 Ag Value 0.03 IU/mL   QuantiFERON TB2 Ag Value 0.03 IU/mL   QuantiFERON Nil Value 0.04 IU/mL   QuantiFERON Mitogen Value 4.41 IU/mL   QuantiFERON-TB Gold Plus Negative Negative  Fecal occult blood, imunochemical   Collection Time: 11/08/23 10:34 AM   Specimen: Stool   ST  Result Value Ref Range   Fecal Occult Bld Positive (A) Negative      Assessment & Plan:   Problem List Items Addressed This Visit   None Visit Diagnoses       Weight loss    -  Primary   Weight has stabilized. Scheduled for colonoscopy in about 2 weeks. Await results. Treat as needed.   Relevant Orders   Comprehensive metabolic panel with GFR     Fecal occult blood test positive       Relevant Orders   CBC with Differential/Platelet   Comprehensive metabolic panel with GFR     Dizziness       Will check labs. Not orthostatic. Encouraged hydration. Call with any  concerns.   Relevant Orders   CBC with Differential/Platelet   Comprehensive metabolic panel with GFR   Magnesium   Phosphorus     Trigger ring finger of left hand       Referral to new ortho placed today.   Relevant Orders   Ambulatory referral to Hand Surgery     Contact dermatitis, unspecified contact dermatitis type, unspecified trigger       Will treat with prednisone  taper. Call with any concerns or if not getting better.        Follow up plan: Return 2-3 months.

## 2024-01-09 ENCOUNTER — Ambulatory Visit: Payer: Self-pay | Admitting: Family Medicine

## 2024-01-09 ENCOUNTER — Encounter: Payer: Self-pay | Admitting: Family Medicine

## 2024-01-09 LAB — COMPREHENSIVE METABOLIC PANEL WITH GFR
ALT: 19 IU/L (ref 0–44)
AST: 23 IU/L (ref 0–40)
Albumin: 4.4 g/dL (ref 3.8–4.8)
Alkaline Phosphatase: 115 IU/L (ref 47–123)
BUN/Creatinine Ratio: 21 (ref 10–24)
BUN: 20 mg/dL (ref 8–27)
Bilirubin Total: 0.6 mg/dL (ref 0.0–1.2)
CO2: 22 mmol/L (ref 20–29)
Calcium: 9.3 mg/dL (ref 8.6–10.2)
Chloride: 100 mmol/L (ref 96–106)
Creatinine, Ser: 0.97 mg/dL (ref 0.76–1.27)
Globulin, Total: 2.9 g/dL (ref 1.5–4.5)
Glucose: 90 mg/dL (ref 70–99)
Potassium: 4.5 mmol/L (ref 3.5–5.2)
Sodium: 140 mmol/L (ref 134–144)
Total Protein: 7.3 g/dL (ref 6.0–8.5)
eGFR: 81 mL/min/1.73 (ref >59)

## 2024-01-09 LAB — CBC WITH DIFFERENTIAL/PLATELET
Basophils Absolute: 0 x10E3/uL (ref 0.0–0.2)
Basos: 0 %
EOS (ABSOLUTE): 0.1 x10E3/uL (ref 0.0–0.4)
Eos: 1 %
Hematocrit: 48.5 % (ref 37.5–51.0)
Hemoglobin: 16.1 g/dL (ref 13.0–17.7)
Immature Grans (Abs): 0 x10E3/uL (ref 0.0–0.1)
Immature Granulocytes: 0 %
Lymphocytes Absolute: 1.3 x10E3/uL (ref 0.7–3.1)
Lymphs: 20 %
MCH: 32.3 pg (ref 26.6–33.0)
MCHC: 33.2 g/dL (ref 31.5–35.7)
MCV: 97 fL (ref 79–97)
Monocytes Absolute: 0.5 x10E3/uL (ref 0.1–0.9)
Monocytes: 8 %
Neutrophils Absolute: 4.5 x10E3/uL (ref 1.4–7.0)
Neutrophils: 70 %
Platelets: 227 x10E3/uL (ref 150–450)
RBC: 4.98 x10E6/uL (ref 4.14–5.80)
RDW: 12.2 % (ref 11.6–15.4)
WBC: 6.4 x10E3/uL (ref 3.4–10.8)

## 2024-01-09 LAB — PHOSPHORUS: Phosphorus: 3.4 mg/dL (ref 2.8–4.1)

## 2024-01-09 LAB — MAGNESIUM: Magnesium: 2.3 mg/dL (ref 1.6–2.3)

## 2024-01-10 ENCOUNTER — Ambulatory Visit

## 2024-01-10 DIAGNOSIS — M65342 Trigger finger, left ring finger: Secondary | ICD-10-CM | POA: Diagnosis not present

## 2024-01-10 NOTE — Patient Instructions (Signed)

## 2024-01-10 NOTE — Progress Notes (Signed)
 Chief Complaint: Left Ring Trigger Finger    History of Present Illness:    Wayne Robles is a 78 y.o. male with 76-month history of left ring trigger finger.  Began with occasional locking of the ring finger.  Patient reports symptoms became more frequent over time however.  Was seen by Dr. Krasinski with [facility] 3 to 4 months ago.  States received trigger finger injection which provided complete relief until about 3 weeks ago.  Patient states locking has returned.  He has concerns with his symptoms in regards to safety with using power tools.  Patient is retired but spends a frequent amount of time in his shop at home.  Patient is right-hand dominant.  No previous history of trigger finger.  Patient denies associated hand weakness, paresthesias, or numbness.  Patient seen by PCP, Duwaine Louder, DO with [facility], on 01/08/2024.  Diagnosed with trigger finger of ring finger of left hand.  Referral placed to orthopedist.  Of note, patient with bilateral lower extremity rash.  Diagnosed with contact dermatitis.  Was started on a oral prednisone  taper.  Has noticed mild symptom improvement regarding finger locking.  Patient with prediabetes, last A1c 10/31/2023, 2 months ago, was 5.6%.      PMH/PSH/Social History/Family History/Allergies/Meds:   Past Medical History:  Diagnosis Date   Depression    History of colonic polyps    Hyperlipidemia    Hyperthyroidism    Vitamin D deficiency    Past Surgical History:  Procedure Laterality Date   APPENDECTOMY     tonsillectomy     Social History   Socioeconomic History   Marital status: Married    Spouse name: Not on file   Number of children: Not on file   Years of education: Not on file   Highest education level: GED or equivalent  Occupational History   Not on file  Tobacco Use   Smoking status: Never   Smokeless tobacco: Never  Vaping Use   Vaping status: Never Used  Substance and Sexual Activity    Alcohol use: No   Drug use: No   Sexual activity: Yes  Other Topics Concern   Not on file  Social History Narrative   Not on file   Social Drivers of Health   Financial Resource Strain: Low Risk  (07/25/2023)   Overall Financial Resource Strain (CARDIA)    Difficulty of Paying Living Expenses: Not hard at all  Food Insecurity: No Food Insecurity (07/25/2023)   Hunger Vital Sign    Worried About Running Out of Food in the Last Year: Never true    Ran Out of Food in the Last Year: Never true  Transportation Needs: No Transportation Needs (07/25/2023)   PRAPARE - Administrator, Civil Service (Medical): No    Lack of Transportation (Non-Medical): No  Physical Activity: Insufficiently Active (07/25/2023)   Exercise Vital Sign    Days of Exercise per Week: 1 day    Minutes of Exercise per Session: 10 min  Stress: No Stress Concern Present (07/25/2023)   [employer] of Occupational Health - Occupational Stress Questionnaire    Feeling of Stress: Not at all  Social Connections: Unknown (07/25/2023)   Social Connection and Isolation Panel    Frequency of Communication with Friends and Family: Twice a week    Frequency of Social Gatherings with Friends and Family: Once a week    Attends Religious Services: Not on file    Active Member  of Clubs or Organizations: No    Attends Engineer, Structural: Not on file    Marital Status: Married   Family History  Problem Relation Age of Onset   Cancer Mother        Ovarian   Diabetes Father    Cancer Father        Prostate   Anxiety disorder Daughter    No Known Allergies Current Outpatient Medications  Medication Sig Dispense Refill   albuterol  (VENTOLIN  HFA) 108 (90 Base) MCG/ACT inhaler Inhale 1-2 puffs into the lungs every 6 (six) hours as needed for wheezing or shortness of breath. 8 each 6   aspirin EC 81 MG tablet Take 81 mg by mouth.     buPROPion  (WELLBUTRIN  XL) 300 MG 24 hr tablet TAKE 1 TABLET(300 MG) BY  MOUTH DAILY 90 tablet 1   diclofenac  Sodium (VOLTAREN ) 1 % GEL Apply 4 g topically 4 (four) times daily. (Patient not taking: Reported on 01/08/2024) 350 g 3   ezetimibe  (ZETIA ) 10 MG tablet Take 1 tablet (10 mg total) by mouth daily. 90 tablet 1   meloxicam  (MOBIC ) 15 MG tablet Take 1 tablet (15 mg total) by mouth daily. 90 tablet 1   Multiple Vitamin (MULTIVITAMIN) tablet Take 1 tablet by mouth daily with breakfast.     predniSONE  (DELTASONE ) 10 MG tablet 6 tabs in the AM with food day 1 and 2, 5 tabs days 3 and 4, decrease by 1 every other day until gone. 42 tablet 0   sildenafil  (VIAGRA ) 100 MG tablet Take 0.5-1 tablets (50-100 mg total) by mouth daily as needed for erectile dysfunction. 5 tablet 11   simvastatin  (ZOCOR ) 40 MG tablet Take 1 tablet (40 mg total) by mouth daily. 90 tablet 1   traZODone  (DESYREL ) 50 MG tablet Take 1 tablet (50 mg total) by mouth at bedtime as needed. for sleep 90 tablet 1   TYLENOL  500 MG tablet Take 500 mg by mouth every 6 (six) hours as needed for mild pain or headache.     No current facility-administered medications for this visit.   No results found.  I have reviewed past medical, surgical, social and family history, medications, and allergies as documented in the EMR.   Review of Systems:    A ROS was performed including pertinent positives and negatives as documented in the HPI.   Physical Exam :    General/Constitutional: NAD and appears stated age Vascular: No edema, swelling or tenderness, except as noted in detailed exam Integumentary: No impressive skin lesions present, except as noted in detailed exam Neurological: Alert and oriented Psych: Appropriate affect and cooperative Musculoskeletal: Normal, except as noted in detailed exam and HPI There were no vitals taken for this visit.    Focused Orthopedic Exam:    Hand/Digit focused exam:  On gross examination of the left upper extremity the skin is intact.  Fingers warm and well  perfused with 2+ radial pulse.  Sensation intact to the Median, Ulnar and Radial nerve distribution of the hand.    AIN/PIN/Ulnar nerve motor function intact.  Negative tinels at the cubital tunnel, negative tinels at the carpal tunnel.    Full flexion and extension of the fingers without any active triggering.  Pain and crepitus about the A1 pulley of the left ring finger.    APB strength 5/5 with no signs of atrophy.    No pain about the basilar thumb joint.   No CMC grind.  Able to make a  composite fist.     Imaging:    No imaging performed today     Assessment and Plan:    Left ring trigger finger   77 y.o. male was seen and examined in office today. We reviewed patient's history, examination, and imaging in detail. Based on information available for this encounter, patient with left ring trigger finger.  Present for over 1 year.  Complete symptom resolution with steroid injection for approximately 3 months, then symptoms returned ([facility] note not available for review).  On physical exam today, there are no active locking/catching symptoms, however tenderness with palpation and palpable crepitus.  Discussed diagnosis and treatment options with patient today.  Patient interested in surgical consideration.  Referral placed to hand specialist with [facility].    I personally saw and evaluated the patient, and participated in the management and treatment plan.  Arlyss GEANNIE Schneider DO Orthopedic Surgery & Sports Medicine [facility] with [facility]  I discussed with the patient today that I will be transitioning out of my role within the near future. In order to provide appropriate continuity of care, we offered the patient options for follow up regarding their orthopedic concerns. Patient has chosen to follow up with [facility].  Patient may reach out to our office if there are any difficulties in scheduling follow up care. The patient understands who  to contact for future orthopedic concerns and has contact information for the receiving practice.    This document was dictated using Conservation officer, historic buildings. A reasonable attempt at proof reading has been made to minimize errors.

## 2024-01-11 NOTE — Addendum Note (Signed)
 Addended by: VICCI DUWAINE SQUIBB on: 01/11/2024 10:04 AM   Modules accepted: Orders

## 2024-01-11 NOTE — Telephone Encounter (Signed)
 Order in.

## 2024-01-12 LAB — SPECIMEN STATUS REPORT

## 2024-01-12 LAB — VITAMIN B12: Vitamin B-12: 440 pg/mL (ref 232–1245)

## 2024-01-15 ENCOUNTER — Ambulatory Visit: Admitting: Orthopedic Surgery

## 2024-01-15 DIAGNOSIS — M65342 Trigger finger, left ring finger: Secondary | ICD-10-CM

## 2024-01-15 NOTE — Progress Notes (Signed)
 Wayne Robles - 78 y.o. male MRN [id number]  Date of birth: 1945/04/19  Office Visit Note: Visit Date: 01/15/2024 PCP: Vicci Duwaine SHAUNNA, DO Referred by: Gust Molly, DO  Subjective: No chief complaint on file.  HPI: DMARION Robles is a pleasant 78 y.o. male who presents today for evaluation of ongoing left ring trigger digit that has been symptomatic now for greater than 1 year.  Has ongoing clicking and locking that has required manual correction.  Has undergone previous injection to the left ring finger A1 pulley region without lasting relief.  At this juncture, he is interested in potential surgical intervention.  Has been sent to me for specific hand surgical evaluation.  Pertinent ROS were reviewed with the patient and found to be negative unless otherwise specified above in HPI.   Visit Reason: left ring trigger digit Duration of symptoms: 1 year Hand dominance: right Occupation:retired Diabetic: No Smoking: No Heart/Lung History: none Blood Thinners:  none  Prior Testing/EMG: none Injections (Date):  yes Treatments: injection Prior Surgery: none    Assessment & Plan: Visit Diagnoses:  1. Trigger finger, left ring finger     Plan: Extensive discussion was had with the patient today regarding his left ring finger trigger digit.  We discussed the etiology and pathophysiology of stenosing tenosynovitis.  We discussed conservative versus surgical treatment modalities.  From a conservative standpoint, we discussed activity modification, splinting, therapy and injections.  From a surgical standpoint, we discussed the possibility for trigger digit release as well as all risk and benefits associated.  Given that patient has trialed conservative treatments such as activity modification and prior injection with symptoms refractory to conservative care, patient is indicated for left ring finger trigger digit release.    Risks and benefits of the procedure were discussed,  risks including but not limited to infection, bleeding, scarring, stiffness, nerve injury, tendon injury, vascular injury, recurrence of symptoms and need for subsequent operation.  We also discussed the appropriate postoperative protocol and timeframe for return to activities and function.  Forms of anesthesia were also discussed.  Patient expressed understanding.  Understanding the above, he would like to proceed with left ring trigger digit release under local anesthesia in the office setting.  Will move forward with surgical scheduling.  Follow-up: No follow-ups on file.   Meds & Orders: No orders of the defined types were placed in this encounter.  No orders of the defined types were placed in this encounter.    Procedures: No procedures performed      Clinical History: No specialty comments available.  He reports that he has never smoked. He has never used smokeless tobacco.  Recent Labs    04/25/23 0823 10/31/23 0846  HGBA1C 6.2* 5.6    Objective:   Vital Signs: There were no vitals taken for this visit.  Physical Exam  Gen: Well-appearing, in no acute distress; non-toxic CV: Regular Rate. Well-perfused. Warm.  Resp: Breathing unlabored on room air; no wheezing. Psych: Fluid speech in conversation; appropriate affect; normal thought process  Ortho Exam Left hand: - Palpable nodule at the A1 pulley of the ring finger, associated tenderness - Notable clicking with deep flexion of the ring finger, notable evidence of significant locking with deep flexion - Sensation intact distally, hand remains warm well-perfused    Imaging: No results found.  Past Medical/Family/Surgical/Social History: Medications & Allergies reviewed per EMR, new medications updated. Patient Active Problem List   Diagnosis Date Noted   Erectile dysfunction 02/17/2023  Aortic atherosclerosis 10/26/2022   Asthma 02/25/2022   IFG (impaired fasting glucose) 08/11/2020   Depression 01/19/2013    Hyperlipidemia 01/19/2013   Past Medical History:  Diagnosis Date   Depression    History of colonic polyps    Hyperlipidemia    Hyperthyroidism    Vitamin D deficiency    Family History  Problem Relation Age of Onset   Cancer Mother        Ovarian   Diabetes Father    Cancer Father        Prostate   Anxiety disorder Daughter    Past Surgical History:  Procedure Laterality Date   APPENDECTOMY     tonsillectomy     Social History   Occupational History   Not on file  Tobacco Use   Smoking status: Never   Smokeless tobacco: Never  Vaping Use   Vaping status: Never Used  Substance and Sexual Activity   Alcohol use: No   Drug use: No   Sexual activity: Yes    Brucha Ahlquist Afton Alderton, M.D. [facility], Hand Surgery

## 2024-01-18 ENCOUNTER — Encounter: Payer: Self-pay | Admitting: Family Medicine

## 2024-01-19 ENCOUNTER — Other Ambulatory Visit: Payer: Self-pay | Admitting: Family Medicine

## 2024-01-19 ENCOUNTER — Encounter: Payer: Self-pay | Admitting: Family Medicine

## 2024-01-19 MED ORDER — CLOBETASOL PROPIONATE 0.05 % EX CREA: 1.0000 | TOPICAL_CREAM | Freq: Two times a day (BID) | CUTANEOUS | 0 refills | Status: DC

## 2024-01-24 ENCOUNTER — Other Ambulatory Visit: Payer: Self-pay

## 2024-01-24 DIAGNOSIS — M65342 Trigger finger, left ring finger: Secondary | ICD-10-CM

## 2024-01-26 ENCOUNTER — Other Ambulatory Visit: Payer: Self-pay

## 2024-01-26 DIAGNOSIS — M65342 Trigger finger, left ring finger: Secondary | ICD-10-CM

## 2024-01-26 LAB — HM COLONOSCOPY

## 2024-01-27 ENCOUNTER — Emergency Department (HOSPITAL_BASED_OUTPATIENT_CLINIC_OR_DEPARTMENT_OTHER)
Admission: EM | Admit: 2024-01-27 | Discharge: 2024-01-27 | Disposition: A | Discharge status: Home / Self Care | Attending: Emergency Medicine | Admitting: Emergency Medicine

## 2024-01-27 ENCOUNTER — Other Ambulatory Visit (HOSPITAL_BASED_OUTPATIENT_CLINIC_OR_DEPARTMENT_OTHER): Payer: Self-pay

## 2024-01-27 ENCOUNTER — Other Ambulatory Visit: Payer: Self-pay

## 2024-01-27 ENCOUNTER — Encounter (HOSPITAL_BASED_OUTPATIENT_CLINIC_OR_DEPARTMENT_OTHER): Payer: Self-pay | Admitting: Emergency Medicine

## 2024-01-27 DIAGNOSIS — R21 Rash and other nonspecific skin eruption: Secondary | ICD-10-CM | POA: Diagnosis not present

## 2024-01-27 DIAGNOSIS — Z7982 Long term (current) use of aspirin: Secondary | ICD-10-CM | POA: Insufficient documentation

## 2024-01-27 DIAGNOSIS — Z79899 Other long term (current) drug therapy: Secondary | ICD-10-CM | POA: Diagnosis not present

## 2024-01-27 IMAGING — DX DG SHOULDER 2+V*L*
3 series · 3 of 3 positions shown · non-contrast
Comparison: None Available.

CLINICAL DATA: Left neck and left shoulder pain.

EXAM:
LEFT SHOULDER - 2+ VIEW

[shoulder grashey]
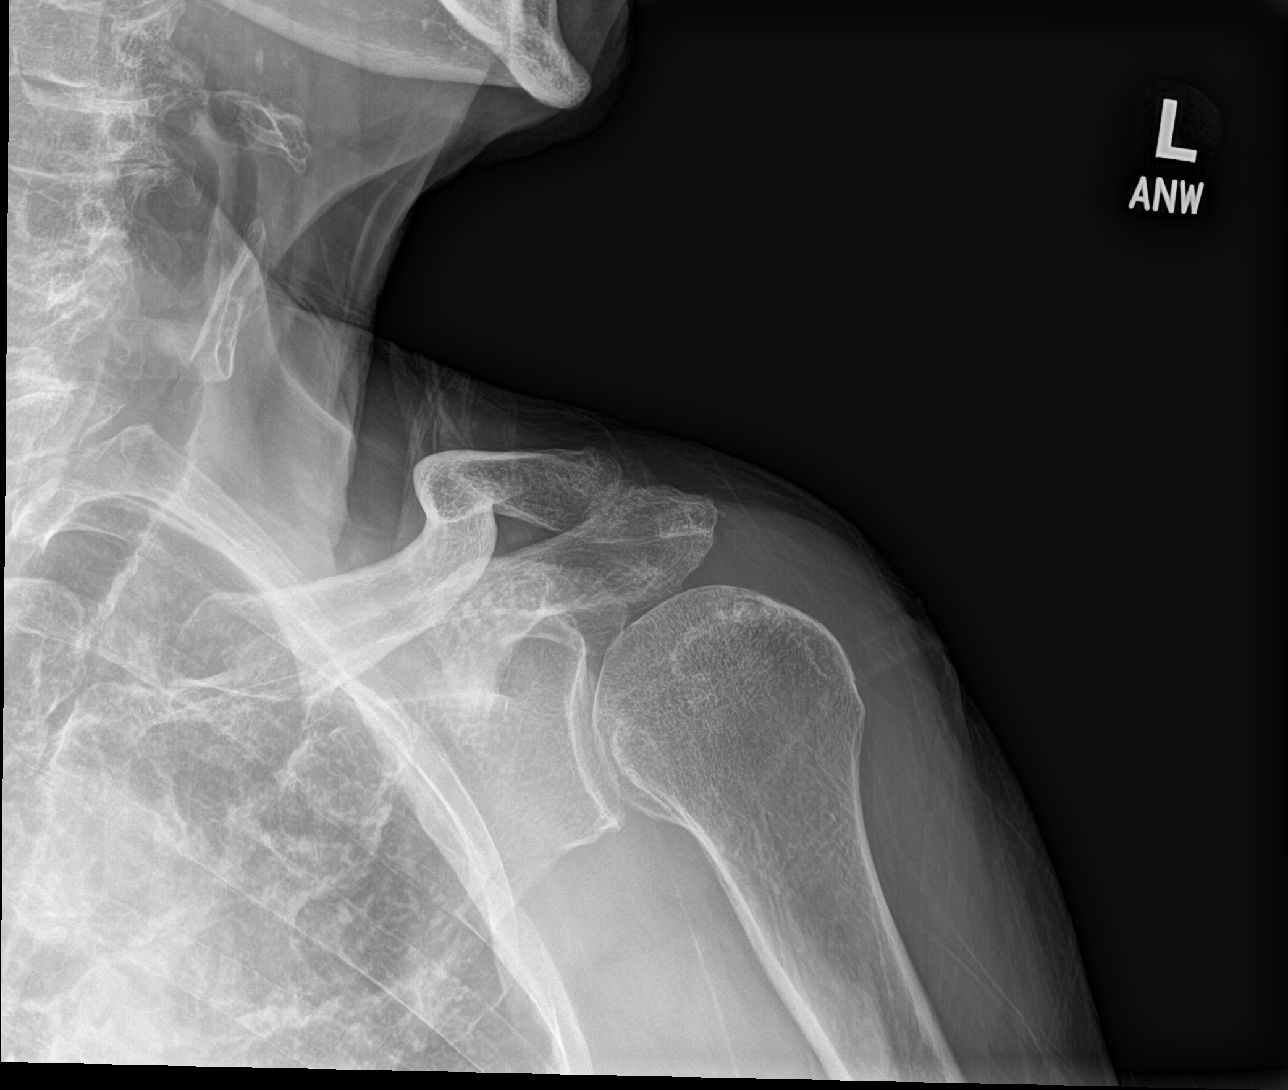

[shoulder y view]
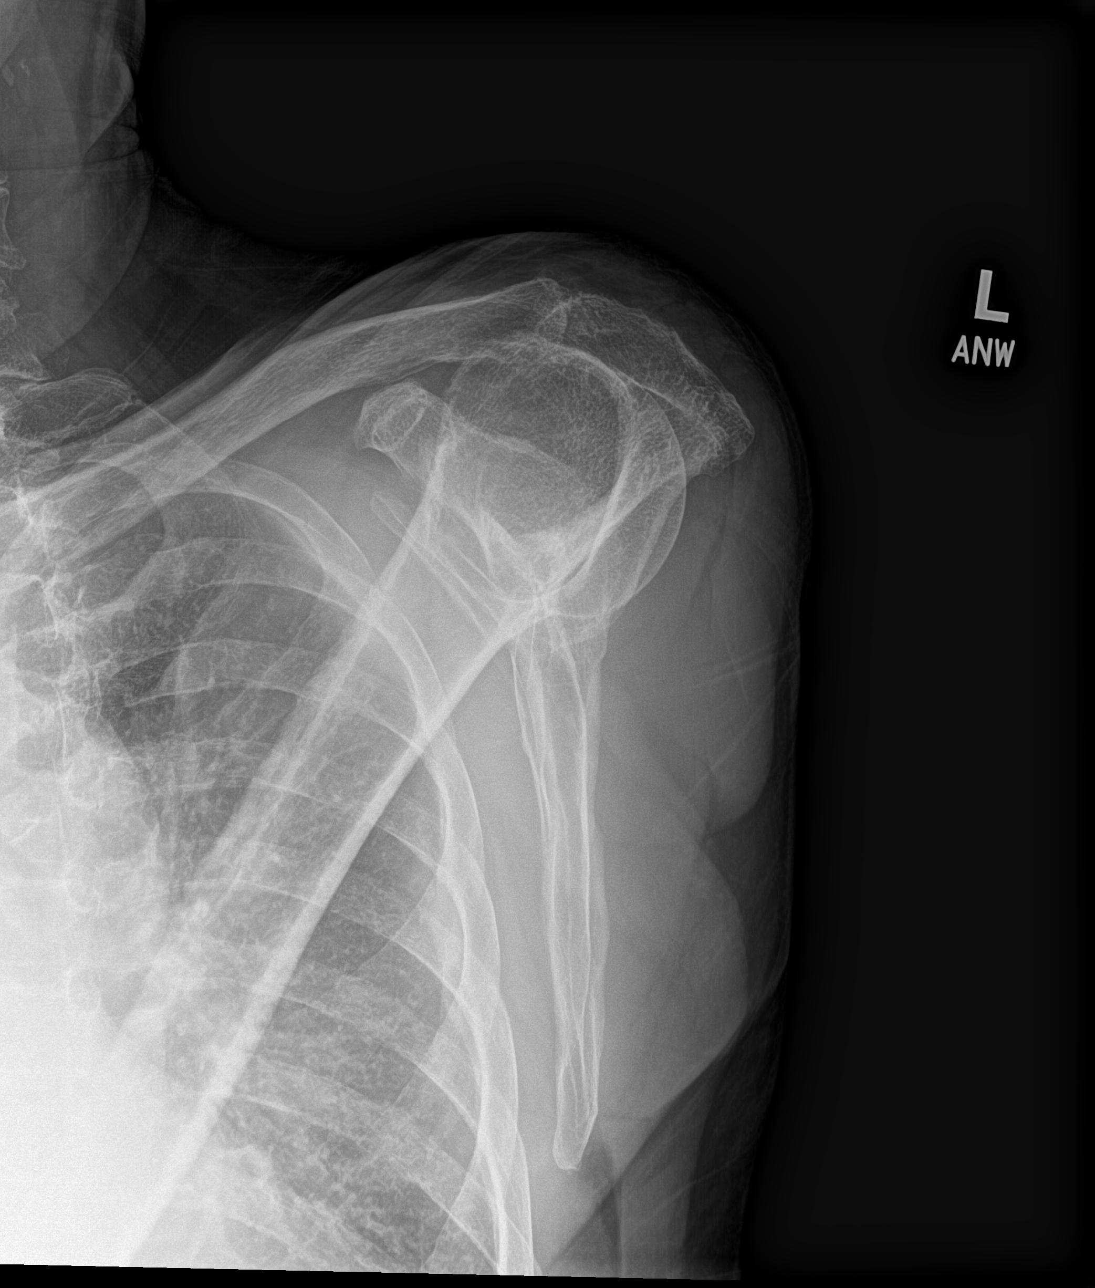

[shoulder axillary]
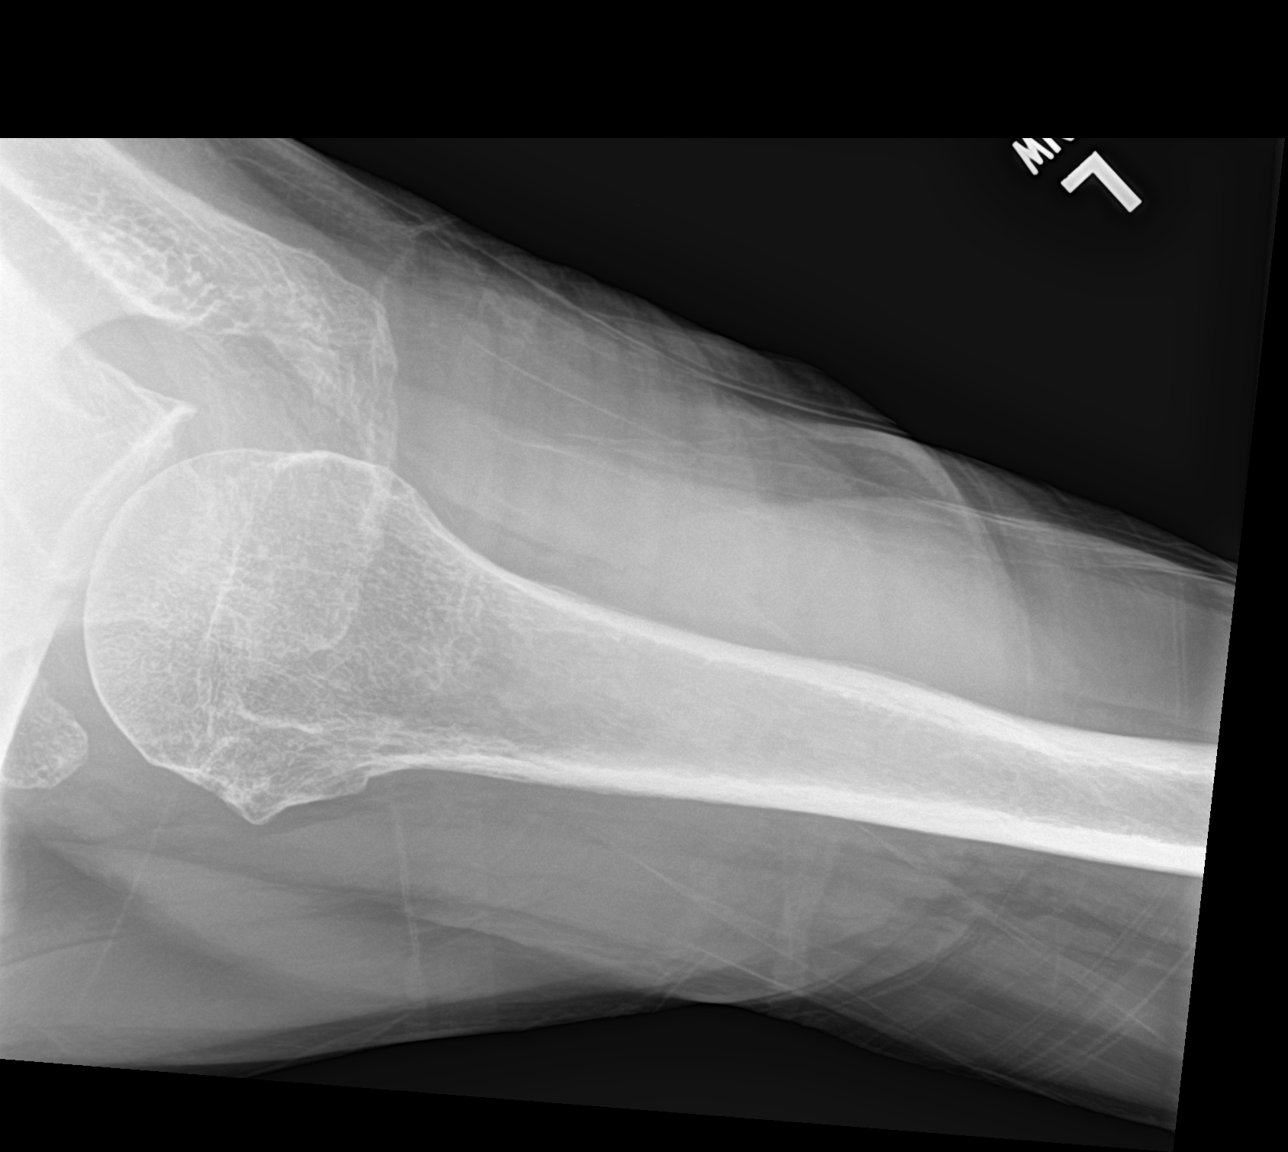

[3 of 3 positions shown; findings below may reference images not displayed]

FINDINGS: Mild glenohumeral joint space narrowing. Mild inferior and posterior
glenoid degenerative spurring. Mild acromioclavicular joint space
narrowing and peripheral osteophytosis. No acute fracture or
dislocation. The visualized portion of the left lung is
unremarkable.
IMPRESSION: Mild left glenohumeral and acromioclavicular osteoarthritis.

## 2024-01-27 MED ORDER — PREDNISONE 10 MG PO TABS
20.0000 mg | ORAL_TABLET | Freq: Every day | ORAL | 0 refills | Status: AC
  Filled 2024-01-27: qty 10, 5d supply, fill #0

## 2024-01-27 NOTE — Discharge Instructions (Signed)
 Take prednisone  as prescribed.  Can use Benadryl at nighttime for itchiness as well.  Follow-up with your primary care doctor.

## 2024-01-27 NOTE — ED Triage Notes (Signed)
 Pt endorses itchy rash on Abd, back and BUE x 1 week. Denies new meds or change in detergents or soaps. Red circular bumps noted

## 2024-01-27 NOTE — ED Provider Notes (Signed)
 " [place] EMERGENCY DEPARTMENT AT [facility] Provider Note   CSN: [id number] Arrival date & time: 01/27/24  1016     Patient presents with: Rash   Wayne Robles is a 78 y.o. male.   Patient here with itchy rash to his back abdomen upper arms for 1 week.  Denies any new meds detergents soaps or exposures.  Small red circular bumps.  Nobody else in the household with the same.  They are not involving his hands or feet.  He denies any fevers or chills.  The history is provided by the patient.       Prior to Admission medications  Medication Sig Start Date End Date Taking? Authorizing Provider  predniSONE  (DELTASONE ) 10 MG tablet Take 2 tablets (20 mg total) by mouth daily for 5 days. 01/27/24 02/01/24 Yes Milania Haubner, DO  albuterol  (VENTOLIN  HFA) 108 (90 Base) MCG/ACT inhaler Inhale 1-2 puffs into the lungs every 6 (six) hours as needed for wheezing or shortness of breath. 10/31/23   Vicci Bouchard P, DO  aspirin EC 81 MG tablet Take 81 mg by mouth.    [provider]  buPROPion  (WELLBUTRIN  XL) 300 MG 24 hr tablet TAKE 1 TABLET(300 MG) BY MOUTH DAILY 10/31/23   Johnson, Megan P, DO  clobetasol  cream (TEMOVATE ) 0.05 % Apply 1 Application topically 2 (two) times daily. 01/19/24   Vicci Bouchard P, DO  diclofenac  Sodium (VOLTAREN ) 1 % GEL Apply 4 g topically 4 (four) times daily. Patient not taking: Reported on 01/08/2024 07/25/23   Vicci Bouchard P, DO  ezetimibe  (ZETIA ) 10 MG tablet Take 1 tablet (10 mg total) by mouth daily. 10/31/23   Johnson, Megan P, DO  meloxicam  (MOBIC ) 15 MG tablet Take 1 tablet (15 mg total) by mouth daily. 10/31/23   Vicci Bouchard P, DO  Multiple Vitamin (MULTIVITAMIN) tablet Take 1 tablet by mouth daily with breakfast.    [provider]  sildenafil  (VIAGRA ) 100 MG tablet Take 0.5-1 tablets (50-100 mg total) by mouth daily as needed for erectile dysfunction. 10/31/23   Johnson, Megan P, DO  simvastatin  (ZOCOR ) 40 MG tablet Take 1  tablet (40 mg total) by mouth daily. 10/31/23   Johnson, Megan P, DO  traZODone  (DESYREL ) 50 MG tablet Take 1 tablet (50 mg total) by mouth at bedtime as needed. for sleep 10/31/23   Vicci Bouchard P, DO  TYLENOL  500 MG tablet Take 500 mg by mouth every 6 (six) hours as needed for mild pain or headache.    [provider]    Allergies: Patient has no known allergies.    Review of Systems  Updated Vital Signs BP 134/74 (BP Location: Right Arm)   Pulse 87   Temp (!) 97.5 F (36.4 C) (Oral)   Resp 15   Wt 76.7 kg   SpO2 100%   BMI 24.96 kg/m   Physical Exam Vitals and nursing note reviewed.  Constitutional:      General: He is not in acute distress.    Appearance: He is well-developed.  HENT:     Head: Normocephalic and atraumatic.  Eyes:     Conjunctiva/sclera: Conjunctivae normal.  Cardiovascular:     Rate and Rhythm: Normal rate and regular rhythm.     Pulses: Normal pulses.     Heart sounds: No murmur heard. Pulmonary:     Effort: Pulmonary effort is normal. No respiratory distress.     Breath sounds: Normal breath sounds.  Abdominal:     Palpations:  Abdomen is soft.     Tenderness: There is no abdominal tenderness.  Musculoskeletal:        General: No swelling.     Cervical back: Neck supple.  Skin:    General: Skin is warm and dry.     Capillary Refill: Capillary refill takes less than 2 seconds.     Findings: Rash present.     Comments: Small nonspecific hives on his back and chest and arms not involving the webs of the hands and feet  Neurological:     Mental Status: He is alert.  Psychiatric:        Mood and Affect: Mood normal.     (all labs ordered are listed, but only abnormal results are displayed) Labs Reviewed - No data to display  EKG: None  Radiology: No results found.   Procedures   Medications Ordered in the ED - No data to display                                  Medical Decision Making Risk Prescription drug  management.   Wayne Robles is here with nonspecific rash.  Looks like small hives.  Does not really look like bedbugs or any sort of mite.  His wife is with him he does not have any new symptoms either.  No new medications or exposures.  History of depression high cholesterol.  Ultimately will treat with steroids.  Does not look fungal in nature.  Does not look like shingles does not look like any emergent type rash.  Overall fairly nonspecific.  Will put him on prednisone  have him follow-up with primary care.  Discharged in good condition.  This chart was dictated using voice recognition software.  Despite best efforts to proofread,  errors can occur which can change the documentation meaning.      Final diagnoses:  Rash    ED Discharge Orders          Ordered    predniSONE  (DELTASONE ) 10 MG tablet  Daily        01/27/24 1105               [place], DO 01/27/24 1106  "

## 2024-02-02 ENCOUNTER — Encounter: Payer: Self-pay | Admitting: Family Medicine

## 2024-02-02 ENCOUNTER — Ambulatory Visit: Payer: Self-pay

## 2024-02-02 NOTE — Telephone Encounter (Signed)
 Recommend they keep appt for 1/5 or if symptoms worsen should be seen in the ED.

## 2024-02-02 NOTE — Telephone Encounter (Signed)
 FYI Only or Action Required?: Action required by provider: request for appointment.  Patient was last seen in primary care on 01/08/2024 by Vicci Duwaine SQUIBB, DO.  Called Nurse Triage reporting Rash.  Symptoms began yesterday. Has been coming and going the past 3 weeks, recurrence as of yesterday  Interventions attempted: OTC medications: Tecnu, benadryl cream and Prescription medications: Prednisone ,  Clobetasole propionate .  Symptoms are: gradually worsening.  Triage Disposition: See PCP When Office is Open (Within 3 Days)  Patient/caregiver understands and will follow disposition?: Yes    Saw PCP 3 weeks ago for rash on lower left inner leg above the ankle. Prescribed oral steroid. Didn't help. Then prescribed a topical steroid cream Clobetasole propionate which helped the original rash, but then developed an all over rash. Stopped taking the topical steroid, went to ED on 12/20 and was prescribed prednisone , completed 3 days ago. Helped the full body rash as well as the leg rash up until yesterday, leg rash now returning. About 20 small red pimples, mildly itchy, no pain. Denies new home or personal products. No appts available in next 3 days at home office for ongoing/recurrent rash x3 weeks. Needing acute visit/ED f/u appt. Soonest appt not until January 5th. Request for appt sent to office. Advised UC for worsening symptoms in the meantime.     Copied from CRM #[id number]. Topic: Clinical - Red Word Triage >> Feb 02, 2024 10:12 AM Carlyon D wrote: Red Word that prompted transfer to Nurse Triage: Rash all over the L leg pt thinks it could be because of the medication prescribed ( steroid cream) stopped using cream but original rash on leg is back, very itchy Reason for Disposition  Localized rash present > 7 days  Answer Assessment - Initial Assessment Questions 1. APPEARANCE of RASH: What does the rash look like? (e.g., blisters, dry flaky skin, red spots, redness, sores)     Small  red pimples, start out flat and then become raised  2. LOCATION: Where is the rash located?      lower left leg on the inside above the ankle  3. NUMBER: How many spots are there?      About 20 pimples  4. SIZE: How big are the spots? (e.g., inches, cm; or compare to size of pinhead, tip of pen, eraser, pea)      Top of a pencil  5. ONSET: When did the rash start?      Restarted yesterday  6. ITCHING: Does the rash itch? If Yes, ask: How bad is the itch?  (Scale 0-10; or none, mild, moderate, severe)     Mild  7. PAIN: Does the rash hurt? If Yes, ask: How bad is the pain?  (Scale 0-10; or none, mild, moderate, severe)     Denies  8. OTHER SYMPTOMS: Do you have any other symptoms? (e.g., fever)     Denies  Protocols used: Rash or Redness - Localized-A-AH

## 2024-02-05 ENCOUNTER — Encounter: Payer: Self-pay | Admitting: Orthopedic Surgery

## 2024-02-06 ENCOUNTER — Telehealth: Payer: Self-pay | Admitting: Family Medicine

## 2024-02-06 ENCOUNTER — Ambulatory Visit (INDEPENDENT_AMBULATORY_CARE_PROVIDER_SITE_OTHER): Admitting: Orthopedic Surgery

## 2024-02-06 DIAGNOSIS — M65342 Trigger finger, left ring finger: Secondary | ICD-10-CM

## 2024-02-06 NOTE — Telephone Encounter (Signed)
 I called and spoke with the patient's wife, and she did verify they are scheduled to come in on 1/5 to see Foss.

## 2024-02-06 NOTE — Telephone Encounter (Signed)
 Copied from CRM #[id number]. Topic: Clinical - Refused Triage >> Feb 06, 2024 10:10 AM Wayne Robles wrote: Patient/caller voiced complaints of rash and lots of itching. Declined transfer to triage and would just like an appt scheduled

## 2024-02-06 NOTE — Progress Notes (Signed)
 Patient procedure was postponed today due to ongoing cat scratches and a pre-existing rash on the operative hand.  He will be seen by his PCP next week for repeat evaluation of the ongoing rash.  He had previously been on steroid medication.  I would also like to see the cat scratch to the left forearm and hand resolve prior to operative intervention.  Will plan on doing a skin check in 2 weeks prior to rescheduling.  Patient expressed full understanding.  Dnasia Gauna, MD Hand Surgery, [place]

## 2024-02-12 ENCOUNTER — Ambulatory Visit

## 2024-02-12 VITALS — BP 135/76 | HR 76 | Temp 97.5°F | Ht 69.0 in | Wt 175.8 lb

## 2024-02-12 DIAGNOSIS — L259 Unspecified contact dermatitis, unspecified cause: Secondary | ICD-10-CM

## 2024-02-12 MED ORDER — CLOBETASOL PROPIONATE 0.05 % EX CREA: 1.0000 | TOPICAL_CREAM | Freq: Two times a day (BID) | CUTANEOUS | 0 refills | Status: DC

## 2024-02-12 MED ORDER — PREDNISONE 10 MG (21) PO TBPK: ORAL_TABLET | ORAL | 0 refills | Status: AC

## 2024-02-12 NOTE — Progress Notes (Signed)
 "  BP 135/76   Pulse 76   Temp (!) 97.5 F (36.4 C) (Oral)   Ht 5' 9 (1.753 m)   Wt 175 lb 12.8 oz (79.7 kg)   SpO2 98%   BMI 25.96 kg/m    Subjective:    Patient ID: Wayne Robles, male    DOB: 03/05/1945, 79 y.o.   MRN: [id number]  HPI: Wayne Robles is a 79 y.o. male with ongoing rash symptoms.  Rash onset early December, seen by PCP for steroid burst, with mild improvement then worsening after discontinuing steroids.  Seen again in ED with little improvement after short course of steroids.  Stopped using clobetasol  cream as pt was concerned this caused the rash - then he restarted the cream yesterday and felt it helped stop the itching and progression.  Denies fever, myalgias, arthralgias, no new food or contact exposures, no similar symptoms in wife.    Chief Complaint  Patient presents with   Rash    Patient states he has been having a rash all over his body for the last month. States the rash comes and goes. States he has taken 2 rounds of Prednisone  and used Clobetasol  cream as well. States that the areas do itch and has been using Benadryl cream to help with the itch.    Relevant past medical, surgical, family and social history reviewed and updated as indicated. Interim medical history since our last visit reviewed. Allergies and medications reviewed and updated.  Review of Systems  Constitutional:  Negative for activity change, chills, fatigue and fever.  Eyes:  Negative for discharge.  Respiratory:  Negative for shortness of breath.   Cardiovascular:  Negative for chest pain and leg swelling.  Gastrointestinal:  Negative for abdominal pain.  Musculoskeletal:  Negative for arthralgias, back pain, gait problem, joint swelling, myalgias and neck pain.  Skin:  Positive for rash and wound.  Neurological:  Negative for light-headedness, numbness and headaches.  Hematological:  Negative for adenopathy. Does not bruise/bleed easily.    Per HPI unless specifically  indicated above     Objective:    BP 135/76   Pulse 76   Temp (!) 97.5 F (36.4 C) (Oral)   Ht 5' 9 (1.753 m)   Wt 175 lb 12.8 oz (79.7 kg)   SpO2 98%   BMI 25.96 kg/m   Wt Readings from Last 3 Encounters:  02/12/24 175 lb 12.8 oz (79.7 kg)  01/27/24 167 lb 8.8 oz (76 kg)  01/08/24 171 lb 6.4 oz (77.7 kg)    Physical Exam Constitutional:      Appearance: Normal appearance.  HENT:     Head: Normocephalic and atraumatic.     Nose: Nose normal.  Cardiovascular:     Rate and Rhythm: Normal rate.  Pulmonary:     Effort: Pulmonary effort is normal.     Breath sounds: No wheezing or rhonchi.  Abdominal:     General: Abdomen is flat.     Palpations: Abdomen is soft.  Musculoskeletal:        General: No swelling or tenderness. Normal range of motion.     Cervical back: Normal range of motion and neck supple.  Lymphadenopathy:     Cervical: No cervical adenopathy.  Skin:    General: Skin is warm and dry.     Findings: Rash present. Rash is crusting, scaling and urticarial.     Comments: Rash present on bilateral flank, shoulder, back, lumbar back, lower legs, forearms.  Neurological:  Mental Status: He is alert and oriented to person, place, and time.  Psychiatric:        Mood and Affect: Mood normal.        Behavior: Behavior normal.     Results for orders placed or performed in visit on 01/08/24  CBC with Differential/Platelet   Collection Time: 01/08/24 11:31 AM  Result Value Ref Range   WBC 6.4 3.4 - 10.8 x10E3/uL   RBC 4.98 4.14 - 5.80 x10E6/uL   Hemoglobin 16.1 13.0 - 17.7 g/dL   Hematocrit 51.4 62.4 - 51.0 %   MCV 97 79 - 97 fL   MCH 32.3 26.6 - 33.0 pg   MCHC 33.2 31.5 - 35.7 g/dL   RDW 87.7 88.3 - 84.5 %   Platelets 227 150 - 450 x10E3/uL   Neutrophils 70 Not Estab. %   Lymphs 20 Not Estab. %   Monocytes 8 Not Estab. %   Eos 1 Not Estab. %   Basos 0 Not Estab. %   Neutrophils Absolute 4.5 1.4 - 7.0 x10E3/uL   Lymphocytes Absolute 1.3 0.7 - 3.1  x10E3/uL   Monocytes Absolute 0.5 0.1 - 0.9 x10E3/uL   EOS (ABSOLUTE) 0.1 0.0 - 0.4 x10E3/uL   Basophils Absolute 0.0 0.0 - 0.2 x10E3/uL   Immature Granulocytes 0 Not Estab. %   Immature Grans (Abs) 0.0 0.0 - 0.1 x10E3/uL  Comprehensive metabolic panel with GFR   Collection Time: 01/08/24 11:31 AM  Result Value Ref Range   Glucose 90 70 - 99 mg/dL   BUN 20 8 - 27 mg/dL   Creatinine, Ser 9.02 0.76 - 1.27 mg/dL   eGFR 81 >40 fO/fpw/8.26   BUN/Creatinine Ratio 21 10 - 24   Sodium 140 134 - 144 mmol/L   Potassium 4.5 3.5 - 5.2 mmol/L   Chloride 100 96 - 106 mmol/L   CO2 22 20 - 29 mmol/L   Calcium 9.3 8.6 - 10.2 mg/dL   Total Protein 7.3 6.0 - 8.5 g/dL   Albumin 4.4 3.8 - 4.8 g/dL   Globulin, Total 2.9 1.5 - 4.5 g/dL   Bilirubin Total 0.6 0.0 - 1.2 mg/dL   Alkaline Phosphatase 115 47 - 123 IU/L   AST 23 0 - 40 IU/L   ALT 19 0 - 44 IU/L  Magnesium   Collection Time: 01/08/24 11:31 AM  Result Value Ref Range   Magnesium 2.3 1.6 - 2.3 mg/dL  Phosphorus   Collection Time: 01/08/24 11:31 AM  Result Value Ref Range   Phosphorus 3.4 2.8 - 4.1 mg/dL  Vitamin B12   Collection Time: 01/08/24 11:31 AM  Result Value Ref Range   Vitamin B-12 440 232 - 1,245 pg/mL  Specimen status report   Collection Time: 01/08/24 11:31 AM  Result Value Ref Range   specimen status report Comment       Assessment & Plan:   Assessment & Plan Contact dermatitis, unspecified contact dermatitis type, unspecified trigger Rash over back, chest, arms, lower legs, urticarial, appears hive-like.  Will send longer prednisone  taper pack, and follow up in 2 weeks.  May take zyrtec OTC, and continue clobetasol  cream. Orders:   predniSONE  (STERAPRED UNI-PAK 21 TAB) 10 MG (21) TBPK tablet; Take 6 tablets (60 mg total) by mouth daily for 2 days, THEN 5 tablets (50 mg total) daily for 2 days, THEN 4 tablets (40 mg total) daily for 2 days, THEN 3 tablets (30 mg total) daily for 2 days, THEN 2 tablets (20 mg total)  daily for 2 days, THEN 1 tablet (10 mg total) daily for 2 days.   clobetasol  cream (TEMOVATE ) 0.05 %; Apply 1 Application topically 2 (two) times daily.   Follow up plan: Return in about 2 weeks (around 02/26/2024) for skin recheck.      "

## 2024-02-13 ENCOUNTER — Other Ambulatory Visit: Payer: Self-pay | Admitting: Internal Medicine

## 2024-02-13 DIAGNOSIS — R634 Abnormal weight loss: Secondary | ICD-10-CM

## 2024-02-13 DIAGNOSIS — R195 Other fecal abnormalities: Secondary | ICD-10-CM

## 2024-02-15 ENCOUNTER — Encounter: Payer: Self-pay | Admitting: Family Medicine

## 2024-02-19 NOTE — Progress Notes (Unsigned)
 Skin check

## 2024-02-20 ENCOUNTER — Ambulatory Visit: Admitting: Orthopedic Surgery

## 2024-02-20 ENCOUNTER — Encounter: Admitting: Rehabilitative and Restorative Service Providers"

## 2024-02-20 DIAGNOSIS — M65342 Trigger finger, left ring finger: Secondary | ICD-10-CM | POA: Diagnosis not present

## 2024-02-22 ENCOUNTER — Ambulatory Visit
Admission: RE | Admit: 2024-02-22 | Discharge: 2024-02-22 | Disposition: A | Source: Ambulatory Visit | Attending: Internal Medicine

## 2024-02-22 DIAGNOSIS — R195 Other fecal abnormalities: Secondary | ICD-10-CM

## 2024-02-22 DIAGNOSIS — R634 Abnormal weight loss: Secondary | ICD-10-CM

## 2024-02-22 MED ORDER — IOPAMIDOL (ISOVUE-370) INJECTION 76%
75.0000 mL | Freq: Once | INTRAVENOUS | Status: AC | PRN
  Administered 2024-02-22: 75 mL via INTRAVENOUS

## 2024-02-26 ENCOUNTER — Ambulatory Visit (INDEPENDENT_AMBULATORY_CARE_PROVIDER_SITE_OTHER): Admitting: Family Medicine

## 2024-02-26 ENCOUNTER — Encounter: Payer: Self-pay | Admitting: Family Medicine

## 2024-02-26 VITALS — BP 118/66 | HR 78 | Temp 97.5°F | Ht 69.0 in | Wt 174.4 lb

## 2024-02-26 DIAGNOSIS — F331 Major depressive disorder, recurrent, moderate: Secondary | ICD-10-CM

## 2024-02-26 DIAGNOSIS — L259 Unspecified contact dermatitis, unspecified cause: Secondary | ICD-10-CM

## 2024-02-26 MED ORDER — FLUOXETINE HCL 10 MG PO CAPS: ORAL_CAPSULE | ORAL | 3 refills | Status: AC

## 2024-02-26 NOTE — Assessment & Plan Note (Signed)
 Not under good control. Will add prozac  and recheck in 1 month. Call with any concerns.

## 2024-02-26 NOTE — Progress Notes (Signed)
 "  BP 118/66   Pulse 78   Temp (!) 97.5 F (36.4 C) (Oral)   Ht 5' 9 (1.753 m)   Wt 174 lb 6.4 oz (79.1 kg)   SpO2 98%   BMI 25.75 kg/m    Subjective:    Patient ID: Wayne Robles, male    DOB: November 06, 1945, 79 y.o.   MRN: [id number]  HPI: Wayne Robles is a 79 y.o. male  Chief Complaint  Patient presents with   Rash   Depression   RASH- resolved with the most recent steroid taper Duration:  weeks  Location: generalized  Itching: yes Burning: yes Redness: yes Oozing: no Scaling: no Blisters: no Painful: no Fevers: no Change in detergents/soaps/personal care products: no Recent illness: no Recent travel:no History of same: yes Context: better Alleviating factors: steroids and zyrtec Treatments attempted: steroids and zyrtec Shortness of breath: no  Throat/tongue swelling: no Myalgias/arthralgias: no  DEPRESSION- has had issues where he'll just sleep for days at a time, this has been going on for years, but was worse the past several months- was having problems from September to November. Wife is very concerned about him.  Mood status: uncontrolled Satisfied with current treatment?: no Symptom severity: severe  Duration of current treatment : chronic Side effects: no Medication compliance: excellent compliance Psychotherapy/counseling: no  Previous psychiatric medications: wellbutrin  Depressed mood: yes Anxious mood: no Anhedonia: no Significant weight loss or gain: yes Insomnia: no  Fatigue: yes Feelings of worthlessness or guilt: no Impaired concentration/indecisiveness: no Suicidal ideations: no Hopelessness: no Crying spells: no    02/26/2024    9:36 AM 01/08/2024   10:44 AM 10/31/2023    8:23 AM 10/31/2023    8:16 AM 10/26/2022    9:08 AM  Depression screen PHQ 2/9  Decreased Interest 0 1 0 0 0  Down, Depressed, Hopeless 0 1 0 0 0  PHQ - 2 Score 0 2 0 0 0  Altered sleeping 0 1 0  0  Tired, decreased energy 0 1 1  0  Change in appetite 0 0  0  0  Feeling bad or failure about yourself  0 0 0  1  Trouble concentrating 0 0 0  0  Moving slowly or fidgety/restless 0 0 0  0  Suicidal thoughts 0 0 0  0  PHQ-9 Score 0 4 1   1    Difficult doing work/chores  Somewhat difficult Not difficult at all  Not difficult at all     Data saved with a previous flowsheet row definition     Relevant past medical, surgical, family and social history reviewed and updated as indicated. Interim medical history since our last visit reviewed. Allergies and medications reviewed and updated.  Review of Systems  Constitutional:  Positive for fatigue. Negative for activity change, appetite change, chills, diaphoresis, fever and unexpected weight change.  Respiratory: Negative.    Cardiovascular: Negative.   Musculoskeletal: Negative.   Skin: Negative.   Neurological: Negative.   Psychiatric/Behavioral:  Positive for dysphoric mood. Negative for agitation, decreased concentration, hallucinations, self-injury, sleep disturbance and suicidal ideas. The patient is not nervous/anxious and is not hyperactive.     Per HPI unless specifically indicated above     Objective:    BP 118/66   Pulse 78   Temp (!) 97.5 F (36.4 C) (Oral)   Ht 5' 9 (1.753 m)   Wt 174 lb 6.4 oz (79.1 kg)   SpO2 98%   BMI 25.75 kg/m  Wt Readings from Last 3 Encounters:  02/26/24 174 lb 6.4 oz (79.1 kg)  02/12/24 175 lb 12.8 oz (79.7 kg)  01/27/24 167 lb 8.8 oz (76 kg)    Physical Exam Vitals and nursing note reviewed.  Constitutional:      General: He is not in acute distress.    Appearance: Normal appearance. He is not ill-appearing, toxic-appearing or diaphoretic.  HENT:     Head: Normocephalic and atraumatic.     Right Ear: External ear normal.     Left Ear: External ear normal.     Nose: Nose normal.     Mouth/Throat:     Mouth: Mucous membranes are moist.     Pharynx: Oropharynx is clear.  Eyes:     General: No scleral icterus.       Right eye: No  discharge.        Left eye: No discharge.     Extraocular Movements: Extraocular movements intact.     Conjunctiva/sclera: Conjunctivae normal.     Pupils: Pupils are equal, round, and reactive to light.  Cardiovascular:     Rate and Rhythm: Normal rate and regular rhythm.     Pulses: Normal pulses.     Heart sounds: Normal heart sounds. No murmur heard.    No friction rub. No gallop.  Pulmonary:     Effort: Pulmonary effort is normal. No respiratory distress.     Breath sounds: Normal breath sounds. No stridor. No wheezing, rhonchi or rales.  Chest:     Chest wall: No tenderness.  Musculoskeletal:        General: Normal range of motion.     Cervical back: Normal range of motion and neck supple.  Skin:    General: Skin is warm and dry.     Capillary Refill: Capillary refill takes less than 2 seconds.     Coloration: Skin is not jaundiced or pale.     Findings: No bruising, erythema, lesion or rash.  Neurological:     General: No focal deficit present.     Mental Status: He is alert and oriented to person, place, and time. Mental status is at baseline.  Psychiatric:        Mood and Affect: Mood normal.        Behavior: Behavior normal.        Thought Content: Thought content normal.        Judgment: Judgment normal.     Results for orders placed or performed in visit on 02/15/24  HM COLONOSCOPY   Collection Time: 01/26/24 12:00 AM  Result Value Ref Range   [facility] Colonoscopy See Report (in chart) See Report (in chart), Patient Reported      Assessment & Plan:   Problem List Items Addressed This Visit       Other   Depression - Primary   Not under good control. Will add prozac  and recheck in 1 month. Call with any concerns.       Relevant Medications   FLUoxetine  (PROZAC ) 10 MG capsule   Other Visit Diagnoses       Contact dermatitis, unspecified contact dermatitis type, unspecified trigger       Resolved with most recent steroid taper. Call if it comes back.         Follow up plan: Return in about 6 weeks (around 04/08/2024).      "

## 2024-02-28 ENCOUNTER — Encounter: Payer: Self-pay | Admitting: Family Medicine

## 2024-02-29 ENCOUNTER — Encounter: Payer: Self-pay | Admitting: Family Medicine

## 2024-02-29 DIAGNOSIS — L259 Unspecified contact dermatitis, unspecified cause: Secondary | ICD-10-CM

## 2024-03-05 ENCOUNTER — Ambulatory Visit: Admitting: Orthopedic Surgery

## 2024-03-06 MED ORDER — CLOBETASOL PROPIONATE 0.05 % EX CREA: 1.0000 | TOPICAL_CREAM | Freq: Two times a day (BID) | CUTANEOUS | 0 refills | Status: AC

## 2024-03-19 ENCOUNTER — Encounter: Admitting: Orthopedic Surgery

## 2024-04-09 ENCOUNTER — Ambulatory Visit: Admitting: Family Medicine
